# Patient Record
Sex: Male | Born: 1945 | Race: White | Hispanic: No | Marital: Married | Smoking: Former smoker
Health system: Southern US, Community
[De-identification: ages and names within clinical notes are randomized; demographics above are authoritative.]

## PROBLEM LIST (undated history)

## (undated) DIAGNOSIS — N400 Enlarged prostate without lower urinary tract symptoms: Secondary | ICD-10-CM

## (undated) DIAGNOSIS — H547 Unspecified visual loss: Secondary | ICD-10-CM

## (undated) DIAGNOSIS — F0280 Dementia in other diseases classified elsewhere without behavioral disturbance: Secondary | ICD-10-CM

## (undated) DIAGNOSIS — J309 Allergic rhinitis, unspecified: Secondary | ICD-10-CM

## (undated) DIAGNOSIS — K635 Polyp of colon: Secondary | ICD-10-CM

## (undated) DIAGNOSIS — F329 Major depressive disorder, single episode, unspecified: Secondary | ICD-10-CM

## (undated) DIAGNOSIS — G309 Alzheimer's disease, unspecified: Secondary | ICD-10-CM

## (undated) DIAGNOSIS — I10 Essential (primary) hypertension: Secondary | ICD-10-CM

## (undated) DIAGNOSIS — G40909 Epilepsy, unspecified, not intractable, without status epilepticus: Secondary | ICD-10-CM

## (undated) DIAGNOSIS — F32A Depression, unspecified: Secondary | ICD-10-CM

## (undated) DIAGNOSIS — E785 Hyperlipidemia, unspecified: Secondary | ICD-10-CM

## (undated) HISTORY — DX: Hyperlipidemia, unspecified: E78.5

## (undated) HISTORY — DX: Dementia in other diseases classified elsewhere without behavioral disturbance: F02.80

## (undated) HISTORY — DX: Benign prostatic hyperplasia without lower urinary tract symptoms: N40.0

## (undated) HISTORY — DX: Essential (primary) hypertension: I10

## (undated) HISTORY — DX: Polyp of colon: K63.5

## (undated) HISTORY — DX: Major depressive disorder, single episode, unspecified: F32.9

## (undated) HISTORY — DX: Depression, unspecified: F32.A

## (undated) HISTORY — DX: Epilepsy, unspecified, not intractable, without status epilepticus: G40.909

## (undated) HISTORY — PX: CHOLECYSTECTOMY: SHX55

## (undated) HISTORY — DX: Unspecified visual loss: H54.7

## (undated) HISTORY — DX: Alzheimer's disease, unspecified: G30.9

## (undated) HISTORY — DX: Allergic rhinitis, unspecified: J30.9

---

## 2001-04-10 DIAGNOSIS — F0280 Dementia in other diseases classified elsewhere without behavioral disturbance: Secondary | ICD-10-CM

## 2001-04-10 HISTORY — DX: Dementia in other diseases classified elsewhere, unspecified severity, without behavioral disturbance, psychotic disturbance, mood disturbance, and anxiety: F02.80

## 2012-07-01 LAB — LIPID PANEL
HDL: 64 mg/dL (ref 35–70)
LDL Cholesterol: 90 mg/dL
Triglycerides: 69 mg/dL (ref 40–160)

## 2012-10-21 LAB — BASIC METABOLIC PANEL
BUN: 10 mg/dL (ref 4–21)
Creatinine: 0.9 mg/dL (ref 0.6–1.3)
Glucose: 105 mg/dL
Potassium: 4.6 mmol/L (ref 3.4–5.3)
Sodium: 141 mmol/L (ref 137–147)

## 2012-11-12 ENCOUNTER — Ambulatory Visit: Payer: Self-pay | Admitting: Neurology

## 2012-12-03 ENCOUNTER — Encounter: Payer: Self-pay | Admitting: Family Medicine

## 2012-12-03 ENCOUNTER — Ambulatory Visit (INDEPENDENT_AMBULATORY_CARE_PROVIDER_SITE_OTHER): Payer: Medicare PPO | Admitting: Family Medicine

## 2012-12-03 VITALS — BP 120/70 | HR 56 | Temp 97.7°F | Ht 68.0 in | Wt 189.0 lb

## 2012-12-03 DIAGNOSIS — I10 Essential (primary) hypertension: Secondary | ICD-10-CM

## 2012-12-03 DIAGNOSIS — Z111 Encounter for screening for respiratory tuberculosis: Secondary | ICD-10-CM

## 2012-12-03 DIAGNOSIS — F0280 Dementia in other diseases classified elsewhere without behavioral disturbance: Secondary | ICD-10-CM | POA: Insufficient documentation

## 2012-12-03 DIAGNOSIS — E785 Hyperlipidemia, unspecified: Secondary | ICD-10-CM

## 2012-12-03 DIAGNOSIS — G40909 Epilepsy, unspecified, not intractable, without status epilepticus: Secondary | ICD-10-CM

## 2012-12-03 NOTE — Patient Instructions (Addendum)
It was so nice to meet you both. Please call your pharmacy and let them know I will be taking over refilling his medications.  Please come see me in 2 months.

## 2012-12-03 NOTE — Progress Notes (Signed)
  Subjective:    Patient ID: Eugene Mcguire, male    DOB: 05/04/1945, 67 y.o.   MRN: [id number]  HPI  67 yo very pleasant male here with his wife to establish care.  Recently moved from [facility] to [facility]. He is a retired IT sales professional.  Alzheimer's disease- visual spatial variant.  Was followed a couple of times by Dr. Shannan Harper at [facility] but has since transferred care to Dr. Malvin Johns at [facility]. Symptoms have been progressing although not rapidly- diagnosed in 2003.  On Namenda 10 mg twice daily and Aricept 10 mg daily. Has participated in several studies at [facility]. He attends the [facility] 2 days per week at [facility]. UTD ophthalmologist.  Also takes Zoloft 25 mg daily.  Seizure disorder- in June 2014 (the day before they moved to [facility]), he had a seizure.  Went to [facility] ER.  Started on Lamotrigine and dose is being titrated up by Dr. Malvin Johns. Per pt's wife, EEG and MRI last week. Has had no further seizures or seizure like activity.  HTN- on Ramipril 5 mg daily and HCTZ 25 mg daily.    HLD- lipid panel UTD- entered in epic. On Lipitor 10 mg daily.  Patient Active Problem List   Diagnosis Date Noted  . HTN (hypertension) 12/03/2012  . Hyperlipidemia   . Alzheimer disease   . Seizure disorder    Past Medical History  Diagnosis Date  . Hyperlipidemia   . Hypertension   . Alzheimer's dementia 2003    visualspacial difficulties  . Colon polyps   . Seizure disorder    Past Surgical History  Procedure Laterality Date  . Cholecystectomy     History  Substance Use Topics  . Smoking status: Former Games developer  . Smokeless tobacco: Not on file     Comment: smoked in college  . Alcohol Use: Not on file   Family History  Problem Relation Age of Onset  . Alcohol abuse Father   . Heart disease Father    Allergies  Allergen Reactions  . Rimantadine Nausea And Vomiting    Hallucinations   No current  outpatient prescriptions on file prior to visit.   No current facility-administered medications on file prior to visit.   The PMH, PSH, Social History, Family History, Medications, and allergies have been reviewed in [facility], and have been updated if relevant.     Review of Systems See HPI    Objective:   Physical Exam BP 120/70  Pulse 56  Temp(Src) 97.7 F (36.5 C)  Ht 5\' 8"  (1.727 m)  Wt 189 lb (85.73 kg)  BMI 28.74 kg/m2 General:  Pleasant male, squinting, does respond to questions, has difficulty with names of family members and history of his illness. Extremities:  no edema     Assessment & Plan:  1. Screening for tuberculosis  - TB Skin Test  2. Seizure disorder On Lamictal.  Followed by Dr. Malvin Johns.  3. Hyperlipidemia Continue Lipitor.  No changes.  Not yet due for labs.  4. Alzheimer disease >45 min spent with face to face with patient, >50% counseling and/or coordinating care and reviewing history and old records. Will continue Namenda (advised his wife he will need to change to XR dosing) and Aricept. Very active in support groups and activities at [facility].  Very supportive wife.   5. HTN (hypertension)  Stable on current rx.  No changes.

## 2012-12-04 ENCOUNTER — Telehealth: Payer: Self-pay | Admitting: *Deleted

## 2012-12-04 MED ORDER — SERTRALINE HCL 25 MG PO TABS: 25.0000 mg | ORAL_TABLET | Freq: Every day | ORAL | Status: DC

## 2012-12-04 MED ORDER — DONEPEZIL HCL 10 MG PO TABS: 10.0000 mg | ORAL_TABLET | Freq: Every day | ORAL | Status: DC

## 2012-12-04 NOTE — Telephone Encounter (Signed)
Yes ok to remain on regular and to refill rx.

## 2012-12-04 NOTE — Telephone Encounter (Signed)
Received refill request for pt's Namenda 10mg  with a note that pt's wife mentioned you were considering switching pt to Namenda XR. Due to product availability pharmacy cannot get the XR, but they have plenty of the regular. Is ok for pt to remain on the regular? If so they need a new rx.

## 2012-12-04 NOTE — Addendum Note (Signed)
Addended by: Baldomero Lamy on: 12/04/2012 01:40 PM   Modules accepted: Orders

## 2012-12-04 NOTE — Telephone Encounter (Signed)
Advised pharmacy.

## 2012-12-05 LAB — TB SKIN TEST
Induration: 0 mm
TB Skin Test: NEGATIVE

## 2012-12-06 ENCOUNTER — Other Ambulatory Visit: Payer: Self-pay | Admitting: *Deleted

## 2012-12-06 MED ORDER — MEMANTINE HCL 10 MG PO TABS: 10.0000 mg | ORAL_TABLET | Freq: Two times a day (BID) | ORAL | Status: DC

## 2012-12-19 ENCOUNTER — Other Ambulatory Visit: Payer: Self-pay

## 2012-12-19 MED ORDER — HYDROCHLOROTHIAZIDE 25 MG PO TABS: 25.0000 mg | ORAL_TABLET | Freq: Every day | ORAL | Status: DC

## 2012-12-19 MED ORDER — RAMIPRIL 5 MG PO CAPS: 5.0000 mg | ORAL_CAPSULE | Freq: Every day | ORAL | Status: DC

## 2012-12-19 MED ORDER — ATORVASTATIN CALCIUM 10 MG PO TABS: 10.0000 mg | ORAL_TABLET | Freq: Every day | ORAL | Status: DC

## 2012-12-19 NOTE — Telephone Encounter (Signed)
Eugene Mcguire request refill ramipril. Atorvastatin, and HCTZ to [facility] [address]. Advised done.

## 2013-01-29 ENCOUNTER — Ambulatory Visit (INDEPENDENT_AMBULATORY_CARE_PROVIDER_SITE_OTHER): Payer: Medicare PPO

## 2013-01-29 DIAGNOSIS — Z23 Encounter for immunization: Secondary | ICD-10-CM

## 2013-02-04 ENCOUNTER — Ambulatory Visit: Payer: Medicare PPO | Admitting: Family Medicine

## 2013-02-05 ENCOUNTER — Ambulatory Visit: Payer: Medicare PPO | Admitting: Family Medicine

## 2013-02-13 ENCOUNTER — Ambulatory Visit (INDEPENDENT_AMBULATORY_CARE_PROVIDER_SITE_OTHER): Payer: Medicare PPO | Admitting: Internal Medicine

## 2013-02-13 ENCOUNTER — Encounter: Payer: Self-pay | Admitting: Internal Medicine

## 2013-02-13 VITALS — BP 118/68 | HR 66 | Temp 97.9°F | Wt 190.0 lb

## 2013-02-13 DIAGNOSIS — F0280 Dementia in other diseases classified elsewhere without behavioral disturbance: Secondary | ICD-10-CM

## 2013-02-13 NOTE — Progress Notes (Signed)
Subjective:    Patient ID: Eugene Mcguire, male    DOB: March 31, 1946, 67 y.o.   MRN: [id number]  HPI  Pt presents to the clinic today with 2 month follow up for Alzheimers- visual spatial varient. He was switched to Namenda XR at his last visit. However, the pharmacy did not have the XR so he continued on his regular Namenda. He has not noticed any issues. He has seen his neurologist. EEG was negative for seizures. MRI of the brain showed progressive atrophy. They did increase his Lamictal. He has been tolerating that well. He would like to know if he could get a handicap placard.  Review of Systems      Past Medical History  Diagnosis Date  . Hyperlipidemia   . Hypertension   . Alzheimer's dementia 2003    visualspacial difficulties  . Colon polyps   . Seizure disorder     Current Outpatient Prescriptions  Medication Sig Dispense Refill  . aspirin EC 81 MG tablet Take 81 mg by mouth daily.      Marland Kitchen atorvastatin (LIPITOR) 10 MG tablet Take 1 tablet (10 mg total) by mouth daily.  90 tablet  1  . B Complex Vitamins (VITAMIN B COMPLEX PO) Take by mouth.      . Coenzyme Q10 (COQ-10) 200 MG CAPS Take by mouth.      . donepezil (ARICEPT) 10 MG tablet Take 1 tablet (10 mg total) by mouth at bedtime.  90 tablet  1  . hydrochlorothiazide (HYDRODIURIL) 25 MG tablet Take 1 tablet (25 mg total) by mouth daily.  90 tablet  1  . lamoTRIgine (LAMICTAL) 25 MG tablet Take 2 tablets daily every morning      . memantine (NAMENDA) 10 MG tablet Take 1 tablet (10 mg total) by mouth 2 (two) times daily.  60 tablet  3  . Multiple Vitamin (MULTIVITAMIN) tablet Take 1 tablet by mouth daily.      . Omega-3 Fatty Acids (FISH OIL) 1200 MG CAPS Take 2 capsules by mouth daily      . ramipril (ALTACE) 5 MG capsule Take 1 capsule (5 mg total) by mouth daily.  90 capsule  1  . sertraline (ZOLOFT) 25 MG tablet Take 1 tablet (25 mg total) by mouth at bedtime.  90 tablet  0   No current facility-administered  medications for this visit.    Allergies  Allergen Reactions  . Rimantadine Nausea And Vomiting    Hallucinations    Family History  Problem Relation Age of Onset  . Alcohol abuse Father   . Heart disease Father     History   Social History  . Marital Status: Married    Spouse Name: N/A    Number of Children: N/A  . Years of Education: N/A   Occupational History  . Not on file.   Social History Main Topics  . Smoking status: Former Games developer  . Smokeless tobacco: Never Used     Comment: smoked in college  . Alcohol Use: Not on file  . Drug Use: Not on file  . Sexual Activity: Not on file   Other Topics Concern  . Not on file   Social History Narrative   Retired Presenter, broadcasting school principal.   Recently moved with his wife (a retired Charity fundraiser) from [place] to [place].   Two grown children- 3 grandchildren.     Constitutional: Denies fever, malaise, fatigue, headache or abrupt weight changes.  HEENT: Denies eye pain, eye redness, ear pain,  ringing in the ears, wax buildup, runny nose, nasal congestion, bloody nose, or sore throat.  Neurological: Pt reports difficulty with memory. Denies dizziness, difficulty with speech or problems with balance and coordination.   No other specific complaints in a complete review of systems (except as listed in HPI above).  Objective:   Physical Exam   BP 118/68  Pulse 66  Temp(Src) 97.9 F (36.6 C) (Tympanic)  Wt 190 lb (86.183 kg)  SpO2 98% Wt Readings from Last 3 Encounters:  02/13/13 190 lb (86.183 kg)  12/03/12 189 lb (85.73 kg)    General: Appears his stated age, well developed, well nourished in NAD. HEENT: Head: normal shape and size; Eyes: sclera white, no icterus, conjunctiva pink; Ears: Tm's gray and intact, normal light reflex; Nose: mucosa pink and moist, septum midline; Throat/Mouth: Teeth present, mucosa pink and moist, no exudate, lesions or ulcerations noted.  Cardiovascular: Normal rate and  rhythm. S1,S2 noted.  No murmur, rubs or gallops noted. No JVD or BLE edema. No carotid bruits noted. Pulmonary/Chest: Normal effort and positive vesicular breath sounds. No respiratory distress. No wheezes, rales or ronchi noted.  Neurological: Alert and oriented. Coordination normal. +DTRs bilaterally.   BMET    Component Value Date/Time   NA 141 10/21/2012   K 4.6 10/21/2012   BUN 10 10/21/2012   CREATININE 0.9 10/21/2012    Lipid Panel     Component Value Date/Time   CHOL 167 07/01/2012   TRIG 69 07/01/2012   HDL 64 07/01/2012   LDLCALC 90 07/01/2012    CBC    Component Value Date/Time   HGB 13.9 10/21/2012   HCT 42 10/21/2012   PLT 356 10/21/2012          Assessment & Plan:

## 2013-02-13 NOTE — Patient Instructions (Signed)
Alzheimer's Disease Caregiver Guide Alzheimer's disease is an illness that affects a person's brain. It causes a person to lose the ability to remember things and make good decisions. As the disease progresses, the person is unable to take care of himself or herself and needs more and more help to do simple tasks. Taking care of someone with Alzheimer's disease can be very challenging and overwhelming.  MEMORY LOSS AND CONFUSION Memory loss and confusion is mild in the beginning stages of the disease. Both of these problems become more severe as the disease progresses. Eventually, the person will not recognize places or even close family members and friends.   Stay calm.  Respond with a short explanation. Long explanations can be overwhelming and confusing.  Avoid corrections that sound like scolding.  Try not to take it personally, even if the person forgets your name. BEHAVIOR CHANGES Behavior changes are part of the disease. The person may develop depression, anxiety, anger, hallucinations, or other behavior changes. These changes can come on suddenly and may be in response to pain, infection, changes in the environment (temperature, noise), overstimulation, or feeling lost or scared.   Try not to take behavior changes personally.  Remain calm and patient.  Do not argue or try to convince the person about a specific point. This will only make him or her more agitated.  Know that the behavior changes are part of the disease process and try to work through it. TIPS TO REDUCE FRUSTRATION  Schedule wisely by making appointments and doing daily tasks, like bathing and dressing, when the person is at his or her best.  Take your time. Simple tasks may take a lot longer, so be sure to allow for plenty of time.  Limit choices. Too many choices can be overwhelming and stressful for the person.  Involve the person in what you are doing.  Stick to a routine.  Avoid new or crowded  situations, if possible.  Use simple words, short sentences, and a calm voice. Only give 1 direction at a time.  Buy clothes and shoes that are easy to put on and take off.  Let people help if they offer. HOME SAFETY Keeping the home safe is very important to reduce the risk of falls and injuries.   Keep floors clear of clutter. Remove rugs, magazine racks, and floor lamps.  Keep hallways well lit.  Put a handrail and nonslip mat in the bathtub or shower.  Put childproof locks on cabinets with dangerous items, such as medicine, alcohol, guns, toxic cleaning items, sharp tools or utensils, matches, or lighters.  Place locks on doors where the person cannot easily see or reach them. This helps ensure that the person cannot wander out of the house and get lost.  Be prepared for emergencies. Keep a list of emergency phone numbers and addresses in a convenient area. PLANS FOR THE FUTURE  Do not put off talking about finances.  Talk about money management. People with Alzheimer's disease have trouble managing their money as the disease gets worse.  Get help from professional advisors regarding financial and legal matters.  Do not put off talking about future care.  Choose a power of attorney. This is someone who can make decisions for the person with Alzheimer's disease when he or she is no longer able to do so.  Talk about driving and when it is the right time to stop. The person's doctor can help give advice on this matter.  Talk about the person's   living situation. If he or she lives alone, you need to make sure he or she is safe. Some people need extra help at home, and others need more care at a nursing home or care center. SUPPORT GROUPS Joining a support group can be very helpful for caregivers of people with Alzheimer's disease. Some advantages to being part of a support group include:   Getting strategies to manage stress.  Sharing experiences with others.  Receiving  emotional comfort and support.  Learning new caregiving skills as the disease progresses.  Knowing what community resources are available and taking advantage of them. SEEK MEDICAL CARE IF:  The person has a fever.  The person has a sudden change in behavior that does not improve with calming strategies.  The person is unable to manage in his or her current living situation.  The person threatens you or anyone else, including himself or herself.  You are no longer able to care for the person. Document Released: 12/07/2003 Document Revised: 09/26/2011 Document Reviewed: 05/03/2011 ExitCare Patient Information 2014 [employer].  

## 2013-02-13 NOTE — Assessment & Plan Note (Signed)
Continues on Namenda and Aricept No medication changes at this time Will fill out a permanent handicap placard and mail to you

## 2013-02-24 ENCOUNTER — Other Ambulatory Visit: Payer: Self-pay | Admitting: Family Medicine

## 2013-02-24 MED ORDER — MEMANTINE HCL 10 MG PO TABS: 10.0000 mg | ORAL_TABLET | Freq: Two times a day (BID) | ORAL | Status: DC

## 2013-02-24 MED ORDER — SERTRALINE HCL 25 MG PO TABS: 25.0000 mg | ORAL_TABLET | Freq: Every day | ORAL | Status: DC

## 2013-02-24 NOTE — Telephone Encounter (Signed)
Pt is needing refill on Namenda 90 day supply and zoloft. Pt uses [facility] on [address].

## 2013-02-25 ENCOUNTER — Encounter: Payer: Self-pay | Admitting: Internal Medicine

## 2013-02-25 ENCOUNTER — Telehealth: Payer: Self-pay

## 2013-02-25 MED ORDER — MEMANTINE HCL 10 MG PO TABS: 10.0000 mg | ORAL_TABLET | Freq: Two times a day (BID) | ORAL | Status: DC

## 2013-02-25 NOTE — Telephone Encounter (Signed)
pts wife left v/m requesting 90 day refill Namenda to [facility] [address]. Spoke with Melissa at [facility] [address] and namenda 10 mg # 180 x 0 given over phone to take place of # 60 x 3 refills sent 02/24/13. This had been verbally OKed by Nicki Reaper NP. Left v/m for Mrs Holst to call office.

## 2013-02-26 NOTE — Telephone Encounter (Signed)
Mrs Wurm notified med called to [facility] [address].

## 2013-05-22 ENCOUNTER — Encounter: Payer: Self-pay | Admitting: Family Medicine

## 2013-05-22 ENCOUNTER — Ambulatory Visit (INDEPENDENT_AMBULATORY_CARE_PROVIDER_SITE_OTHER): Payer: Medicare PPO | Admitting: Family Medicine

## 2013-05-22 ENCOUNTER — Telehealth: Payer: Self-pay

## 2013-05-22 VITALS — BP 122/66 | HR 61 | Temp 97.9°F | Wt 190.5 lb

## 2013-05-22 DIAGNOSIS — R350 Frequency of micturition: Secondary | ICD-10-CM

## 2013-05-22 DIAGNOSIS — R351 Nocturia: Secondary | ICD-10-CM | POA: Insufficient documentation

## 2013-05-22 LAB — POCT URINALYSIS DIPSTICK
BILIRUBIN UA: NEGATIVE
Glucose, UA: NEGATIVE
Ketones, UA: NEGATIVE
Leukocytes, UA: NEGATIVE
Nitrite, UA: NEGATIVE
PH UA: 6.5
Protein, UA: NEGATIVE
RBC UA: NEGATIVE
Spec Grav, UA: 1.01
Urobilinogen, UA: 0.2

## 2013-05-22 MED ORDER — SERTRALINE HCL 25 MG PO TABS: 25.0000 mg | ORAL_TABLET | Freq: Every day | ORAL | Status: DC

## 2013-05-22 MED ORDER — MEMANTINE HCL 10 MG PO TABS: 10.0000 mg | ORAL_TABLET | Freq: Two times a day (BID) | ORAL | Status: DC

## 2013-05-22 MED ORDER — DONEPEZIL HCL 10 MG PO TABS: 10.0000 mg | ORAL_TABLET | Freq: Every day | ORAL | Status: DC

## 2013-05-22 NOTE — Progress Notes (Signed)
Pre-visit discussion using our clinic review tool. No additional management support is needed unless otherwise documented below in the visit note.  

## 2013-05-22 NOTE — Assessment & Plan Note (Signed)
UA neg. Reassurance provided that this is not infectious. ? Some bladder misfirings due to his disease. Continue depends at night. His wife will update me next week- I have a follow up appt scheduled with him in one month.  He will keep this appt.

## 2013-05-22 NOTE — Progress Notes (Signed)
Subjective:    Patient ID: Eugene Mcguire, male    DOB: 05-24-1945, 68 y.o.   MRN: [id number]  Urinary Frequency  Associated symptoms include frequency.    68 yo very pleasant male with h/o visual spatial AD here for increased urinary frequency, mainly at night x 1 week.  Has had a few episodes of incontinence.  No dysuria, back pain, nausea, vomiting or fevers.  Uses bedside commode.  Did have some myoclonic reflexes the morning this started.  Alzheimer's disease- visual spatial variant.  Was followed a couple of times by [facility] but has since transferred care to [facility]vin [facility]. Symptoms have been progressing although not rapidly- diagnosed in 2003.  On Namenda 10 mg twice daily and Aricept 10 mg daily. Has [facility] studies at [facility]. He [facility].  Patient Active Problem List   Diagnosis Date Noted  . Nocturia 05/22/2013  . HTN (hypertension) 12/03/2012  . Hyperlipidemia   . Alzheimer disease   . Seizure disorder    Past Medical History  Diagnosis Date  . Hyperlipidemia   . Hypertension   . Alzheimer's dementia 2003    visualspacial difficulties  . Colon polyps   . Seizure disorder    Past Surgical History  Procedure Laterality Date  . Cholecystectomy     History  Substance Use Topics  . Smoking status: Former Games developermoker  . Smokeless tobacco: Never Used     Comment: smoked in college  . Alcohol Use: Not on file   Family History  Problem Relation Age of Onset  . Alcohol abuse Father   . Heart disease Father    Allergies  Allergen Reactions  . Rimantadine Nausea And Vomiting    Hallucinations   Current Outpatient Prescriptions on File Prior to Visit  Medication Sig Dispense Refill  . aspirin EC 81 MG tablet Take 81 mg by mouth daily.      Marland Kitchen. atorvastatin (LIPITOR) 10 MG tablet Take 1 tablet (10 mg total) by mouth daily.  90 tablet   1  . B Complex Vitamins (VITAMIN B COMPLEX PO) Take by mouth.      . Coenzyme Q10 (COQ-10) 200 MG CAPS Take by mouth.      . donepezil (ARICEPT) 10 MG tablet Take 1 tablet (10 mg total) by mouth at bedtime.  90 tablet  1  . hydrochlorothiazide (HYDRODIURIL) 25 MG tablet Take 1 tablet (25 mg total) by mouth daily.  90 tablet  1  . lamoTRIgine (LAMICTAL) 100 MG tablet Take 100 mg by mouth 2 (two) times daily.      . memantine (NAMENDA) 10 MG tablet Take 1 tablet (10 mg total) by mouth 2 (two) times daily.  180 tablet  0  . Multiple Vitamin (MULTIVITAMIN) tablet Take 1 tablet by mouth daily.      . Omega-3 Fatty Acids (FISH OIL) 1200 MG CAPS Take 2 capsules by mouth daily      . ramipril (ALTACE) 5 MG capsule Take 1 capsule (5 mg total) by mouth daily.  90 capsule  1  . sertraline (ZOLOFT) 25 MG tablet Take 1 tablet (25 mg total) by mouth at bedtime.  90 tablet  0   No current facility-administered medications on file prior to visit.   The PMH, PSH, Social History, Family History, Medications, and allergies have been reviewed in [facility], and have been updated if relevant.  Review of Systems  Genitourinary: Positive for frequency.   See HPI    Objective:   Physical Exam BP 122/66  Pulse 61  Temp(Src) 97.9 F (36.6 C) (Oral)  Wt 190 lb 8 oz (86.41 kg)  SpO2 98% General:  Pleasant male, squinting, does respond to questions, has difficulty with names of family members and history of his illness. Abd: soft, NT Extremities:  no edema     Assessment & Plan:

## 2013-05-22 NOTE — Telephone Encounter (Signed)
Did not receive cb but pt kept appt. Today to see Dr Dayton MartesAron.

## 2013-05-22 NOTE — Telephone Encounter (Signed)
Mrs Eugene Mcguire left v/m; pt has appt today at 12 noon with Dr Dayton MartesAron and pt has voided 20 cc in urine cup; Mrs Eugene Mcguire wants to know if that is sufficient. Left v/m for pt to cb.

## 2013-06-12 ENCOUNTER — Ambulatory Visit: Payer: Medicare PPO | Admitting: Family Medicine

## 2013-06-19 ENCOUNTER — Ambulatory Visit (INDEPENDENT_AMBULATORY_CARE_PROVIDER_SITE_OTHER): Payer: Medicare PPO | Admitting: Family Medicine

## 2013-06-19 ENCOUNTER — Encounter: Payer: Self-pay | Admitting: Family Medicine

## 2013-06-19 VITALS — BP 124/78 | HR 65 | Temp 98.3°F | Wt 188.5 lb

## 2013-06-19 DIAGNOSIS — E785 Hyperlipidemia, unspecified: Secondary | ICD-10-CM

## 2013-06-19 DIAGNOSIS — G309 Alzheimer's disease, unspecified: Principal | ICD-10-CM

## 2013-06-19 DIAGNOSIS — G40909 Epilepsy, unspecified, not intractable, without status epilepticus: Secondary | ICD-10-CM

## 2013-06-19 DIAGNOSIS — F0280 Dementia in other diseases classified elsewhere without behavioral disturbance: Secondary | ICD-10-CM

## 2013-06-19 DIAGNOSIS — Z9989 Dependence on other enabling machines and devices: Secondary | ICD-10-CM

## 2013-06-19 DIAGNOSIS — I10 Essential (primary) hypertension: Secondary | ICD-10-CM

## 2013-06-19 DIAGNOSIS — G4733 Obstructive sleep apnea (adult) (pediatric): Secondary | ICD-10-CM

## 2013-06-19 LAB — LIPID PANEL
CHOL/HDL RATIO: 3
Cholesterol: 181 mg/dL (ref 0–200)
HDL: 66.4 mg/dL (ref >39.00)
LDL CALC: 95 mg/dL (ref 0–99)
Triglycerides: 96 mg/dL (ref 0.0–149.0)
VLDL: 19.2 mg/dL (ref 0.0–40.0)

## 2013-06-19 LAB — COMPREHENSIVE METABOLIC PANEL
ALK PHOS: 56 U/L (ref 39–117)
ALT: 20 U/L (ref 0–53)
AST: 22 U/L (ref 0–37)
Albumin: 4.1 g/dL (ref 3.5–5.2)
BILIRUBIN TOTAL: 0.5 mg/dL (ref 0.3–1.2)
BUN: 12 mg/dL (ref 6–23)
CO2: 33 meq/L — AB (ref 19–32)
Calcium: 9.5 mg/dL (ref 8.4–10.5)
Chloride: 100 mEq/L (ref 96–112)
Creatinine, Ser: 0.9 mg/dL (ref 0.4–1.5)
GFR: 94.88 mL/min (ref >60.00)
Glucose, Bld: 87 mg/dL (ref 70–99)
Potassium: 3.9 mEq/L (ref 3.5–5.1)
Sodium: 137 mEq/L (ref 135–145)
TOTAL PROTEIN: 6.8 g/dL (ref 6.0–8.3)

## 2013-06-19 MED ORDER — RAMIPRIL 5 MG PO CAPS: 5.0000 mg | ORAL_CAPSULE | Freq: Every day | ORAL | Status: DC

## 2013-06-19 MED ORDER — ATORVASTATIN CALCIUM 10 MG PO TABS: 10.0000 mg | ORAL_TABLET | Freq: Every day | ORAL | Status: DC

## 2013-06-19 MED ORDER — HYDROCHLOROTHIAZIDE 25 MG PO TABS: 25.0000 mg | ORAL_TABLET | Freq: Every day | ORAL | Status: DC

## 2013-06-19 NOTE — Assessment & Plan Note (Signed)
Continue current dose of lipitor. Recheck lipids today.

## 2013-06-19 NOTE — Progress Notes (Signed)
Pre visit review using our clinic review tool, if applicable. No additional management support is needed unless otherwise documented below in the visit note. 

## 2013-06-19 NOTE — Assessment & Plan Note (Signed)
Continue current rx. Symptoms stable.

## 2013-06-19 NOTE — Assessment & Plan Note (Signed)
No recurrent seizures

## 2013-06-19 NOTE — Progress Notes (Signed)
Subjective:    Patient ID: Eugene Mcguire, male    DOB: 29-Mar-1946, 68 y.o.   MRN: [id number]  HPI  Very pleasant 68 yo male here for 6 month follow up.    [place] [facility]. He is a retired IT sales professionalelementary school principal.  Nocturia has improved. Alzheimer's disease- visual spatial variant.  Was followed a couple of times by Dr. Shannan HarperStrittmatter at [facility] but has since transferred care to Dr. Malvin [facility]Potter [facility].  Last saw him in February.  Symptoms have been progressing although not rapidly- diagnosed in 2003.  On Namenda 10 mg twice daily and Aricept 10 mg daily.  He has seen his neurologist. EEG was negative for seizures. MRI of the brain showed progressive atrophy. They did increase his Lamictal to 100 mg twice daily. He has been tolerating that well. He would like to know if he could get a handicap placard.  He att[facility].  Really enjoys this--especially deserts they serve there.  UTD ophthalmologist.  Also takes Zoloft 25 mg daily.   HTN- on Ramipril 5 mg daily and HCTZ 25 mg daily.   Lab Results  Component Value Date   CREATININE 0.9 10/21/2012     HLD-  On Lipitor 10 mg daily. Lab Results  Component Value Date   CHOL 167 07/01/2012   HDL 64 07/01/2012   LDLCALC 90 07/01/2012   TRIG 69 07/01/2012     Patient Active Problem List   Diagnosis Date Noted  . Nocturia 05/22/2013  . HTN (hypertension) 12/03/2012  . Hyperlipidemia   . Alzheimer disease   . Seizure disorder    Past Medical History  Diagnosis Date  . Hyperlipidemia   . Hypertension   . Alzheimer's dementia 2003    visualspacial difficulties  . Colon polyps   . Seizure disorder    Past Surgical History  Procedure Laterality Date  . Cholecystectomy     History  Substance Use Topics  . Smoking status: Former Games developermoker  . Smokeless tobacco: Never Used     Comment: smoked in college  .  Alcohol Use: Not on file   Family History  Problem Relation Age of Onset  . Alcohol abuse Father   . Heart disease Father    Allergies  Allergen Reactions  . Rimantadine Nausea And Vomiting    Hallucinations   Current Outpatient Prescriptions on File Prior to Visit  Medication Sig Dispense Refill  . aspirin EC 81 MG tablet Take 81 mg by mouth daily.      . B Complex Vitamins (VITAMIN B COMPLEX PO) Take by mouth.      . Coenzyme Q10 (COQ-10) 200 MG CAPS Take by mouth.      . donepezil (ARICEPT) 10 MG tablet Take 1 tablet (10 mg total) by mouth at bedtime.  90 tablet  1  . lamoTRIgine (LAMICTAL) 100 MG tablet Take 100 mg by mouth 2 (two) times daily.      . memantine (NAMENDA) 10 MG tablet Take 1 tablet (10 mg total) by mouth 2 (two) times daily.  180 tablet  1  . Multiple Vitamin (MULTIVITAMIN) tablet Take 1 tablet by mouth daily.      . Omega-3 Fatty Acids (FISH OIL) 1200 MG CAPS Take 2 capsules by mouth daily      . sertraline (ZOLOFT) 25 MG tablet Take 1 tablet (25 mg total) by mouth at bedtime.  90  tablet  1   No current facility-administered medications on file prior to visit.   The PMH, PSH, Social History, Family History, Medications, and allergies have been reviewed in [facility], and have been updated if relevant.     Review of Systems See HPI    No CP or SOB Objective:   Physical Exam BP 124/78  Pulse 65  Temp(Src) 98.3 F (36.8 C) (Oral)  Wt 188 lb 8 oz (85.503 kg)  SpO2 97% Wt Readings from Last 3 Encounters:  06/19/13 188 lb 8 oz (85.503 kg)  05/22/13 190 lb 8 oz (86.41 kg)  02/13/13 190 lb (86.183 kg)    General:  Pleasant male, squinting, does respond to questions, has difficulty with names of family members and history of his illness. Resp:  CTA bilaterally CVS:  RRR Extremities:  no edema     Assessment & Plan:

## 2013-06-19 NOTE — Assessment & Plan Note (Signed)
No longer using his CPAP as his wife was worried about him falling in the middle of the night.  Sleeping on his side and feels fine- no hypersomnolence or increased BP

## 2013-06-19 NOTE — Assessment & Plan Note (Signed)
Well controlled. No changes. 

## 2013-06-19 NOTE — Patient Instructions (Signed)
Great to see you.  We will call you with your lab results or you can view them online. 

## 2013-06-20 ENCOUNTER — Encounter: Payer: Self-pay | Admitting: Family Medicine

## 2013-06-20 ENCOUNTER — Telehealth: Payer: Self-pay | Admitting: Family Medicine

## 2013-06-20 NOTE — Telephone Encounter (Signed)
Relevant patient education assigned to patient using Emmi. ° °

## 2013-10-16 ENCOUNTER — Ambulatory Visit: Payer: Medicare PPO | Admitting: Family Medicine

## 2013-10-22 ENCOUNTER — Ambulatory Visit (INDEPENDENT_AMBULATORY_CARE_PROVIDER_SITE_OTHER): Payer: Medicare PPO | Admitting: Family Medicine

## 2013-10-22 ENCOUNTER — Encounter: Payer: Self-pay | Admitting: Family Medicine

## 2013-10-22 ENCOUNTER — Telehealth: Payer: Self-pay | Admitting: Family Medicine

## 2013-10-22 VITALS — BP 116/72 | HR 65 | Temp 98.4°F | Wt 190.0 lb

## 2013-10-22 DIAGNOSIS — G309 Alzheimer's disease, unspecified: Principal | ICD-10-CM

## 2013-10-22 DIAGNOSIS — G40909 Epilepsy, unspecified, not intractable, without status epilepticus: Secondary | ICD-10-CM

## 2013-10-22 DIAGNOSIS — I10 Essential (primary) hypertension: Secondary | ICD-10-CM

## 2013-10-22 DIAGNOSIS — G4733 Obstructive sleep apnea (adult) (pediatric): Secondary | ICD-10-CM

## 2013-10-22 DIAGNOSIS — Z111 Encounter for screening for respiratory tuberculosis: Secondary | ICD-10-CM

## 2013-10-22 DIAGNOSIS — Z9989 Dependence on other enabling machines and devices: Secondary | ICD-10-CM

## 2013-10-22 DIAGNOSIS — F0280 Dementia in other diseases classified elsewhere without behavioral disturbance: Secondary | ICD-10-CM

## 2013-10-22 DIAGNOSIS — E785 Hyperlipidemia, unspecified: Secondary | ICD-10-CM

## 2013-10-22 NOTE — Assessment & Plan Note (Signed)
Visual spatial variant. Progressive but appears to be slow. No changes in rx. Forms filled out. TB skin test given today.

## 2013-10-22 NOTE — Addendum Note (Signed)
Addended by: Desmond DikeKNIGHT, Donnald Tabar H on: 10/22/2013 10:58 AM   Modules accepted: Orders

## 2013-10-22 NOTE — Progress Notes (Signed)
Subjective:    Patient ID: Eugene Mcguire, male    DOB: 28-Jun-1945, 68 y.o.   MRN: [id number]  HPI  Very pleasant 68 yo male here with his wife for 6 month follow up.     Has form to fill out for adult day care- needs TB skin test. [facility].  Really enjoys this--especially deserts they serve there.  Alzheimer's disease- visual spatial variant.  Was followed a couple of times by Dr[facility] Shannan [facility]ittmatter at [facility] but has since transferred care to [facility] Mal[facility]ter [facility]. No recent changes to rx. [facility].    Symptoms have been progressing although not rapidly- diagnosed in 2003.  On Namenda 10 mg twice daily and Aricept 10 mg daily.  Stopped using bedside commode because he would get disoriented at night- increased fall risk.  Now using urinal. They are pleased with this.   HTN- on Ramipril 5 mg daily and HCTZ 25 mg daily.   Lab Results  Component Value Date   CREATININE 0.9 06/19/2013   HLD-  On Lipitor 10 mg daily. Lab Results  Component Value Date   CHOL 181 06/19/2013   HDL 66.40 06/19/2013   LDLCALC 95 06/19/2013   TRIG 96.0 06/19/2013   CHOLHDL 3 06/19/2013     Patient Active Problem List   Diagnosis Date Noted  . OSA on CPAP 06/19/2013  . Nocturia 05/22/2013  . HTN (hypertension) 12/03/2012  . Hyperlipidemia   . Alzheimer disease   . Seizure disorder    Past Medical History  Diagnosis Date  . Hyperlipidemia   . Hypertension   . Alzheimer's dementia 2003    visualspacial difficulties  . Colon polyps   . Seizure disorder    Past Surgical History  Procedure Laterality Date  . Cholecystectomy     History  Substance Use Topics  . Smoking status: Former Games developermoker  . Smokeless tobacco: Never Used     Comment: smoked in college  . Alcohol Use: Not on file   Family History  Problem Relation Age of Onset  . Alcohol  abuse Father   . Heart disease Father    Allergies  Allergen Reactions  . Rimantadine Nausea And Vomiting    Hallucinations   Current Outpatient Prescriptions on File Prior to Visit  Medication Sig Dispense Refill  . aspirin EC 81 MG tablet Take 81 mg by mouth daily.      Marland Kitchen. atorvastatin (LIPITOR) 10 MG tablet Take 1 tablet (10 mg total) by mouth daily.  90 tablet  1  . donepezil (ARICEPT) 10 MG tablet Take 1 tablet (10 mg total) by mouth at bedtime.  90 tablet  1  . hydrochlorothiazide (HYDRODIURIL) 25 MG tablet Take 1 tablet (25 mg total) by mouth daily.  90 tablet  1  . lamoTRIgine (LAMICTAL) 100 MG tablet Take 100 mg by mouth 2 (two) times daily.      . memantine (NAMENDA) 10 MG tablet Take 1 tablet (10 mg total) by mouth 2 (two) times daily.  180 tablet  1  . Multiple Vitamin (MULTIVITAMIN) tablet Take 1 tablet by mouth daily.      . Omega-3 Fatty Acids (FISH OIL) 1200 MG CAPS Take 2 capsules by mouth daily      . ramipril (ALTACE) 5 MG capsule Take 1 capsule (5 mg total) by mouth daily.  90 capsule  1  . sertraline (ZOLOFT) 25 MG tablet Take 1 tablet (25 mg total) by mouth at bedtime.  90 tablet  1   No current facility-administered medications on file prior to visit.   The PMH, PSH, Social History, Family History, Medications, and allergies have been reviewed in [facility], and have been updated if relevant.     Review of Systems See HPI    No CP or SOB Objective:   Physical Exam BP 116/72  Pulse 65  Temp(Src) 98.4 F (36.9 C) (Oral)  Wt 190 lb (86.183 kg)  SpO2 95% Wt Readings from Last 3 Encounters:  10/22/13 190 lb (86.183 kg)  06/19/13 188 lb 8 oz (85.503 kg)  05/22/13 190 lb 8 oz (86.41 kg)    General:  Pleasant male, squinting, does respond to questions, has difficulty with names of family members and history of his illness. Resp:  CTA bilaterally CVS:  RRR Extremities:  no edema     Assessment & Plan:

## 2013-10-22 NOTE — Assessment & Plan Note (Signed)
Well controlled on current rx. No changes. 

## 2013-10-22 NOTE — Progress Notes (Signed)
Pre visit review using our clinic review tool, if applicable. No additional management support is needed unless otherwise documented below in the visit note. 

## 2013-10-22 NOTE — Telephone Encounter (Signed)
Relevant patient education assigned to patient using Emmi. ° °

## 2013-10-24 LAB — TB SKIN TEST: TB Skin Test: NEGATIVE

## 2013-11-20 ENCOUNTER — Other Ambulatory Visit: Payer: Self-pay | Admitting: *Deleted

## 2013-11-20 MED ORDER — SERTRALINE HCL 25 MG PO TABS: 25.0000 mg | ORAL_TABLET | Freq: Every day | ORAL | Status: DC

## 2013-11-20 MED ORDER — HYDROCHLOROTHIAZIDE 25 MG PO TABS: 25.0000 mg | ORAL_TABLET | Freq: Every day | ORAL | Status: DC

## 2013-11-20 MED ORDER — MEMANTINE HCL 10 MG PO TABS: 10.0000 mg | ORAL_TABLET | Freq: Two times a day (BID) | ORAL | Status: DC

## 2013-11-20 MED ORDER — RAMIPRIL 5 MG PO CAPS: 5.0000 mg | ORAL_CAPSULE | Freq: Every day | ORAL | Status: DC

## 2013-11-20 MED ORDER — DONEPEZIL HCL 10 MG PO TABS: 10.0000 mg | ORAL_TABLET | Freq: Every day | ORAL | Status: DC

## 2013-11-21 ENCOUNTER — Ambulatory Visit (INDEPENDENT_AMBULATORY_CARE_PROVIDER_SITE_OTHER): Payer: Medicare PPO | Admitting: Podiatry

## 2013-11-21 ENCOUNTER — Encounter: Payer: Self-pay | Admitting: Podiatry

## 2013-11-21 VITALS — BP 113/61 | HR 66 | Resp 16 | Ht 68.0 in | Wt 185.0 lb

## 2013-11-21 DIAGNOSIS — M79609 Pain in unspecified limb: Secondary | ICD-10-CM

## 2013-11-21 DIAGNOSIS — B351 Tinea unguium: Secondary | ICD-10-CM

## 2013-11-21 DIAGNOSIS — L600 Ingrowing nail: Secondary | ICD-10-CM

## 2013-11-21 NOTE — Progress Notes (Signed)
Subjective:     Patient ID: Eugene Mcguire, male   DOB: Dec 18, 1945, 68 y.o.   MRN: [id number]  HPI patient presents with caregiver with a painful ingrown toenail of the right big toe lateral border stating that they've tried to soak and trimming without relief   Review of Systems  All other systems reviewed and are negative.      Objective:   Physical Exam  Nursing note and vitals reviewed. Constitutional: He is oriented to person, place, and time.  Cardiovascular: Intact distal pulses.   Musculoskeletal: Normal range of motion.  Neurological: He is oriented to person, place, and time.  Skin: Skin is warm.   neurovascular status intact with muscle strength adequate and range of motion subtalar and midtarsal joint within normal limits. Patient has an incurvated right hallux lateral border that's painful when pressed and is noted to have well-perfused digits. No other complaints except for some thickness and discoloration of nailbeds     Assessment:     Ingrown toenail deformity right hallux along with fungal infiltration of both feet nailbeds    Plan:     H&P and condition explained to caregiver. I've recommended removal of the corner the nail and basic treatment for remaining nail bed. Patient understands risk as his caregiver and today I infiltrated 60 mg I can Marcaine mixture remove the lateral border exposed matrix and apply chemical phenol 3 applications 30 seconds followed by alcohol lavaged and sterile dressing instructed on soaks and reappoint

## 2013-11-21 NOTE — Patient Instructions (Signed)

## 2013-11-21 NOTE — Progress Notes (Signed)
   Subjective:    Patient ID: Colen DarlingWilliam Artley, male    DOB: 16-Aug-1945, 68 y.o.   MRN: [id number]  HPI Comments: i have an ingrown toenail on my big toenail on my rt foot. Its been like this for 1 week. Its getting worse. Its sore to the touch. i have been using neosporin, soaks in epsom salt, otc fungus cream, vicks vapor rub, soaks in vinegar and water and pts wife trims toenails.      Review of Systems  Eyes: Positive for visual disturbance.  Cardiovascular: Positive for palpitations.  Musculoskeletal:       Difficulty walking  Skin:       Color change Change in nails  Neurological: Positive for seizures.  Psychiatric/Behavioral: Positive for confusion.  All other systems reviewed and are negative.      Objective:   Physical Exam        Assessment & Plan:

## 2013-12-08 ENCOUNTER — Other Ambulatory Visit: Payer: Self-pay | Admitting: *Deleted

## 2013-12-08 MED ORDER — ATORVASTATIN CALCIUM 10 MG PO TABS: 10.0000 mg | ORAL_TABLET | Freq: Every day | ORAL | Status: DC

## 2013-12-10 ENCOUNTER — Ambulatory Visit (INDEPENDENT_AMBULATORY_CARE_PROVIDER_SITE_OTHER): Payer: Medicare PPO

## 2013-12-10 DIAGNOSIS — Z23 Encounter for immunization: Secondary | ICD-10-CM

## 2013-12-24 ENCOUNTER — Ambulatory Visit: Payer: Medicare PPO | Admitting: Family Medicine

## 2014-02-23 ENCOUNTER — Other Ambulatory Visit: Payer: Self-pay | Admitting: *Deleted

## 2014-02-23 MED ORDER — SERTRALINE HCL 25 MG PO TABS: 25.0000 mg | ORAL_TABLET | Freq: Every day | ORAL | Status: DC

## 2014-02-26 ENCOUNTER — Ambulatory Visit (INDEPENDENT_AMBULATORY_CARE_PROVIDER_SITE_OTHER): Payer: Medicare PPO

## 2014-02-26 ENCOUNTER — Telehealth: Payer: Self-pay

## 2014-02-26 DIAGNOSIS — Z23 Encounter for immunization: Secondary | ICD-10-CM

## 2014-02-26 NOTE — Telephone Encounter (Signed)
Yes he should get prevnar 13.  Pneumovax was likely mis entered previously.

## 2014-02-26 NOTE — Telephone Encounter (Signed)
Pt is scheduled 02/26/14 at 3:30 pm for nurse visit for prevnar 13; pts immunization record has pt getting prevnar 09/02/2009([facility] Pt had first visit with Dr Dayton MartesAron on 12/03/12. Should pt get prevnar 13 today?

## 2014-04-21 ENCOUNTER — Encounter: Payer: Self-pay | Admitting: Family Medicine

## 2014-04-21 ENCOUNTER — Ambulatory Visit (INDEPENDENT_AMBULATORY_CARE_PROVIDER_SITE_OTHER): Payer: Medicare PPO | Admitting: Family Medicine

## 2014-04-21 VITALS — BP 122/78 | HR 60 | Temp 97.9°F | Wt 188.5 lb

## 2014-04-21 DIAGNOSIS — E785 Hyperlipidemia, unspecified: Secondary | ICD-10-CM

## 2014-04-21 DIAGNOSIS — G309 Alzheimer's disease, unspecified: Secondary | ICD-10-CM

## 2014-04-21 DIAGNOSIS — Z9989 Dependence on other enabling machines and devices: Secondary | ICD-10-CM

## 2014-04-21 DIAGNOSIS — G40909 Epilepsy, unspecified, not intractable, without status epilepticus: Secondary | ICD-10-CM

## 2014-04-21 DIAGNOSIS — F0280 Dementia in other diseases classified elsewhere without behavioral disturbance: Secondary | ICD-10-CM

## 2014-04-21 DIAGNOSIS — I10 Essential (primary) hypertension: Secondary | ICD-10-CM

## 2014-04-21 DIAGNOSIS — K5900 Constipation, unspecified: Secondary | ICD-10-CM | POA: Insufficient documentation

## 2014-04-21 DIAGNOSIS — G4733 Obstructive sleep apnea (adult) (pediatric): Secondary | ICD-10-CM

## 2014-04-21 LAB — LIPID PANEL
CHOLESTEROL: 205 mg/dL — AB (ref 0–200)
HDL: 61.7 mg/dL (ref >39.00)
LDL CALC: 106 mg/dL — AB (ref 0–99)
NonHDL: 143.3
TRIGLYCERIDES: 187 mg/dL — AB (ref 0.0–149.0)
Total CHOL/HDL Ratio: 3
VLDL: 37.4 mg/dL (ref 0.0–40.0)

## 2014-04-21 LAB — COMPREHENSIVE METABOLIC PANEL
ALT: 20 U/L (ref 0–53)
AST: 19 U/L (ref 0–37)
Albumin: 4 g/dL (ref 3.5–5.2)
Alkaline Phosphatase: 56 U/L (ref 39–117)
BILIRUBIN TOTAL: 0.5 mg/dL (ref 0.2–1.2)
BUN: 10 mg/dL (ref 6–23)
CALCIUM: 9.2 mg/dL (ref 8.4–10.5)
CHLORIDE: 96 meq/L (ref 96–112)
CO2: 33 mEq/L — ABNORMAL HIGH (ref 19–32)
Creatinine, Ser: 0.8 mg/dL (ref 0.4–1.5)
GFR: 101.51 mL/min (ref >60.00)
Glucose, Bld: 102 mg/dL — ABNORMAL HIGH (ref 70–99)
Potassium: 4.4 mEq/L (ref 3.5–5.1)
SODIUM: 132 meq/L — AB (ref 135–145)
Total Protein: 6.9 g/dL (ref 6.0–8.3)

## 2014-04-21 NOTE — Assessment & Plan Note (Signed)
Well controlled. No changes made today. 

## 2014-04-21 NOTE — Assessment & Plan Note (Signed)
Followed by neurology. No changes made to rx.  

## 2014-04-21 NOTE — Assessment & Plan Note (Signed)
Advised to decrease dose of power pudding. Call or return to clinic prn if these symptoms worsen or fail to improve as anticipated.

## 2014-04-21 NOTE — Progress Notes (Signed)
Subjective:    Patient ID: Eugene DarlingWilliam Malter, male    DOB: 04/29/1945, 69 y.o.   MRN: [id number]  HPI  Very pleasant 69 yo male here with his wife for 6 month follow up.     Has form to fill out for adult day care- needs TB skin test. [facility].  Really enjoys this--especially deserts they serve there.  Alzheimer's disease- visual spatial variant.  Was followed a couple of times by Dr[facility]ann[facility]ittmatter at [facility] but has since transferred care to [facility] Malvin [facility]Potter [facility]. Seeing him for follow up in 1-2 weeks. No recent changes to rx. [facility].    Symptoms have been progressing although not rapidly- diagnosed in 2003.  On Namenda 10 mg twice daily and Aricept 10 mg daily.  Stopped using bedside commode because he would get disoriented at night- increased fall risk.  Now using urinal. They are pleased with this.  He has been using the urinal without difficulty.  Does have intermittent constipation- power pudding effective but sometimes causes a watery stool.  HTN- on Ramipril 5 mg daily and HCTZ 25 mg daily.   Lab Results  Component Value Date   CREATININE 0.9 06/19/2013   HLD-  On Lipitor 10 mg daily. Lab Results  Component Value Date   CHOL 181 06/19/2013   HDL 66.40 06/19/2013   LDLCALC 95 06/19/2013   TRIG 96.0 06/19/2013   CHOLHDL 3 06/19/2013     Patient Active Problem List   Diagnosis Date Noted  . OSA on CPAP 06/19/2013  . Nocturia 05/22/2013  . HTN (hypertension) 12/03/2012  . Hyperlipidemia   . Alzheimer disease   . Seizure disorder    Past Medical History  Diagnosis Date  . Hyperlipidemia   . Hypertension   . Alzheimer's dementia 2003    visualspacial difficulties  . Colon polyps   . Seizure disorder    Past Surgical History  Procedure Laterality Date  . Cholecystectomy     History  Substance Use Topics   . Smoking status: Former Games developermoker  . Smokeless tobacco: Never Used     Comment: smoked in college  . Alcohol Use: Yes   Family History  Problem Relation Age of Onset  . Alcohol abuse Father   . Heart disease Father    Allergies  Allergen Reactions  . Rimantadine Nausea And Vomiting    Hallucinations   Current Outpatient Prescriptions on File Prior to Visit  Medication Sig Dispense Refill  . aspirin EC 81 MG tablet Take 81 mg by mouth daily.    Marland Kitchen. atorvastatin (LIPITOR) 10 MG tablet Take 1 tablet (10 mg total) by mouth daily. 90 tablet 1  . donepezil (ARICEPT) 10 MG tablet Take 1 tablet (10 mg total) by mouth at bedtime. 90 tablet 1  . hydrochlorothiazide (HYDRODIURIL) 25 MG tablet Take 1 tablet (25 mg total) by mouth daily. 90 tablet 1  . lamoTRIgine (LAMICTAL) 100 MG tablet Take 100 mg by mouth 2 (two) times daily.    . memantine (NAMENDA) 10 MG tablet Take 1 tablet (10 mg total) by mouth 2 (two) times daily. 180 tablet 1  . Multiple Vitamin (MULTIVITAMIN) tablet Take 1 tablet by mouth daily.    . Omega-3 Fatty Acids (FISH OIL) 1200 MG CAPS Take 2 capsules by mouth daily    . ramipril (ALTACE) 5  MG capsule Take 1 capsule (5 mg total) by mouth daily. 90 capsule 1  . sertraline (ZOLOFT) 25 MG tablet Take 1 tablet (25 mg total) by mouth at bedtime. 90 tablet 0   No current facility-administered medications on file prior to visit.   The PMH, PSH, Social History, Family History, Medications, and allergies have been reviewed in [facility], and have been updated if relevant.     Review of Systems  Constitutional: Negative.   HENT: Negative.   Respiratory: Negative.   Gastrointestinal: Positive for diarrhea and constipation. Negative for nausea, vomiting, abdominal pain, blood in stool, abdominal distention, anal bleeding and rectal pain.  Endocrine: Negative.   Genitourinary: Negative.   Musculoskeletal: Negative.   Skin: Negative.   Neurological: Negative.   Hematological: Negative.    Psychiatric/Behavioral: Negative.   All other systems reviewed and are negative.     No CP or SOB Sleeping well Wt Readings from Last 3 Encounters:  04/21/14 188 lb 8 oz (85.503 kg)  11/21/13 185 lb (83.915 kg)  10/22/13 190 lb (86.183 kg)    Objective:   Physical Exam BP 122/78 mmHg  Pulse 60  Temp(Src) 97.9 F (36.6 C) (Oral)  Wt 188 lb 8 oz (85.503 kg)  SpO2 97% Wt Readings from Last 3 Encounters:  04/21/14 188 lb 8 oz (85.503 kg)  11/21/13 185 lb (83.915 kg)  10/22/13 190 lb (86.183 kg)    General:  Pleasant male, squinting, does respond to questions, makes jokes- seems joyful today HEENT:  Normocephalic Resp:  CTA bilaterally, normal respiratory effor CVS:  RRR Abd:   Soft, NT, pos BS Extremities:  no edema  Skin:  No rashes Neuro:  Alert, oriented        Assessment & Plan:

## 2014-04-21 NOTE — Progress Notes (Signed)
Pre visit review using our clinic review tool, if applicable. No additional management support is needed unless otherwise documented below in the visit note. 

## 2014-04-21 NOTE — Patient Instructions (Signed)
Great to see you. Happy New year.  We will call you with your lab results.

## 2014-04-21 NOTE — Assessment & Plan Note (Signed)
Due for labs- will recheck today.

## 2014-04-23 ENCOUNTER — Ambulatory Visit: Payer: Medicare PPO | Admitting: Family Medicine

## 2014-05-05 ENCOUNTER — Telehealth: Payer: Self-pay

## 2014-05-05 ENCOUNTER — Other Ambulatory Visit: Payer: Self-pay | Admitting: Family Medicine

## 2014-05-05 DIAGNOSIS — I10 Essential (primary) hypertension: Secondary | ICD-10-CM

## 2014-05-05 NOTE — Telephone Encounter (Signed)
His sodium was low.  If he has cut back on drinking quite as much water and not having any new mental status changes, his sodium is likely within normal limits by now.  Low sodium can be dangerous however, which is why I asked to recheck it.

## 2014-05-05 NOTE — Telephone Encounter (Signed)
Mrs Eugene Mcguire left v/m; pt is scheduled to have labs rechecked on 05/07/14 and pt's wife wants to know if really needs to have labs retested; pt has extreme difficulty getting in and out of car. Mrs Eugene Mcguire wants cb to explain reason in more detail given at result note why pt has to repeat labs.

## 2014-05-06 NOTE — Telephone Encounter (Signed)
Spoke to pts wife and advised per Dr Dayton MartesAron; states that she will keep lab appt 1/28 to repeat sodium

## 2014-05-06 NOTE — Telephone Encounter (Signed)
If she would prefer to not bring him, that is understandable and her choice and likely ok but I obviously cannot say what his sodium level is now without lab results.

## 2014-05-06 NOTE — Telephone Encounter (Signed)
Spoke to pts wife and advised per Dr Dayton MartesAron. She states that since she has reduced his water intake, and he has had no mental status change, are you still wanting to repeat the labs, or is pt ok with current status?

## 2014-05-06 NOTE — Telephone Encounter (Signed)
Lm on pts vm requesting a call back 

## 2014-05-07 ENCOUNTER — Other Ambulatory Visit (INDEPENDENT_AMBULATORY_CARE_PROVIDER_SITE_OTHER): Payer: Medicare PPO

## 2014-05-07 DIAGNOSIS — I10 Essential (primary) hypertension: Secondary | ICD-10-CM

## 2014-05-07 LAB — BASIC METABOLIC PANEL
BUN: 9 mg/dL (ref 6–23)
CHLORIDE: 98 meq/L (ref 96–112)
CO2: 31 mEq/L (ref 19–32)
CREATININE: 0.86 mg/dL (ref 0.40–1.50)
Calcium: 9.2 mg/dL (ref 8.4–10.5)
GFR: 93.37 mL/min (ref >60.00)
Glucose, Bld: 92 mg/dL (ref 70–99)
Potassium: 3.9 mEq/L (ref 3.5–5.1)
Sodium: 136 mEq/L (ref 135–145)

## 2014-05-08 ENCOUNTER — Encounter: Payer: Self-pay | Admitting: *Deleted

## 2014-05-18 ENCOUNTER — Other Ambulatory Visit: Payer: Self-pay | Admitting: *Deleted

## 2014-05-18 MED ORDER — MEMANTINE HCL 10 MG PO TABS: 10.0000 mg | ORAL_TABLET | Freq: Two times a day (BID) | ORAL | Status: DC

## 2014-05-18 MED ORDER — DONEPEZIL HCL 10 MG PO TABS: 10.0000 mg | ORAL_TABLET | Freq: Every day | ORAL | Status: DC

## 2014-05-18 MED ORDER — SERTRALINE HCL 25 MG PO TABS: 25.0000 mg | ORAL_TABLET | Freq: Every day | ORAL | Status: DC

## 2014-06-04 ENCOUNTER — Other Ambulatory Visit: Payer: Self-pay

## 2014-06-04 MED ORDER — RAMIPRIL 5 MG PO CAPS: 5.0000 mg | ORAL_CAPSULE | Freq: Every day | ORAL | Status: DC

## 2014-06-04 MED ORDER — ATORVASTATIN CALCIUM 10 MG PO TABS: 10.0000 mg | ORAL_TABLET | Freq: Every day | ORAL | Status: DC

## 2014-06-04 MED ORDER — HYDROCHLOROTHIAZIDE 25 MG PO TABS: 25.0000 mg | ORAL_TABLET | Freq: Every day | ORAL | Status: DC

## 2014-06-04 NOTE — Telephone Encounter (Signed)
Mrs Kathlene NovemberMcCormick request refills to [facility] [address] for atorvastatin,HCTZ and ramipril.advised pts wife done.

## 2014-06-25 ENCOUNTER — Ambulatory Visit (INDEPENDENT_AMBULATORY_CARE_PROVIDER_SITE_OTHER): Payer: Medicare PPO | Admitting: Internal Medicine

## 2014-06-25 ENCOUNTER — Encounter: Payer: Self-pay | Admitting: Internal Medicine

## 2014-06-25 VITALS — BP 130/76 | HR 62 | Temp 97.8°F | Wt 186.0 lb

## 2014-06-25 DIAGNOSIS — R3911 Hesitancy of micturition: Secondary | ICD-10-CM

## 2014-06-25 DIAGNOSIS — R350 Frequency of micturition: Secondary | ICD-10-CM

## 2014-06-25 DIAGNOSIS — N400 Enlarged prostate without lower urinary tract symptoms: Secondary | ICD-10-CM

## 2014-06-25 LAB — POCT URINALYSIS DIPSTICK
BILIRUBIN UA: NEGATIVE
Blood, UA: NEGATIVE
GLUCOSE UA: NEGATIVE
KETONES UA: NEGATIVE
Leukocytes, UA: NEGATIVE
NITRITE UA: NEGATIVE
PH UA: 6
Protein, UA: NEGATIVE
Spec Grav, UA: 1.015
UROBILINOGEN UA: NEGATIVE

## 2014-06-25 MED ORDER — TAMSULOSIN HCL 0.4 MG PO CAPS: 0.4000 mg | ORAL_CAPSULE | Freq: Every day | ORAL | Status: DC

## 2014-06-25 NOTE — Progress Notes (Signed)
Subjective:    Patient ID: Eugene Mcguire, male    DOB: 11-11-1945, 69 y.o.   MRN: [id number]  HPI  Pt presents to the clinic today with frequency of urination. His wife noticed this 1 month ago. He denies dysuria or blood in his urine. His wife also reports he gets up 5-6 times per night to urinate. He denies fever, chills or low back pain. He has never had a prostate problem that he is aware of. His wife if unsure if he has been screened for prostate cancer. He is on HCTZ but has been on this for some time.  Review of Systems      Past Medical History  Diagnosis Date  . Hyperlipidemia   . Hypertension   . Alzheimer's dementia 2003    visualspacial difficulties  . Colon polyps   . Seizure disorder     Current Outpatient Prescriptions  Medication Sig Dispense Refill  . aspirin EC 81 MG tablet Take 81 mg by mouth daily.    Marland Kitchen. atorvastatin (LIPITOR) 10 MG tablet Take 1 tablet (10 mg total) by mouth daily. 90 tablet 1  . donepezil (ARICEPT) 10 MG tablet Take 1 tablet (10 mg total) by mouth at bedtime. 90 tablet 1  . hydrochlorothiazide (HYDRODIURIL) 25 MG tablet Take 1 tablet (25 mg total) by mouth daily. 90 tablet 1  . lamoTRIgine (LAMICTAL) 100 MG tablet Take 100 mg by mouth 2 (two) times daily.    . memantine (NAMENDA) 10 MG tablet Take 1 tablet (10 mg total) by mouth 2 (two) times daily. 180 tablet 1  . Multiple Vitamin (MULTIVITAMIN) tablet Take 1 tablet by mouth daily.    . Omega-3 Fatty Acids (FISH OIL) 1200 MG CAPS Take 2 capsules by mouth daily    . ramipril (ALTACE) 5 MG capsule Take 1 capsule (5 mg total) by mouth daily. 90 capsule 1  . sertraline (ZOLOFT) 25 MG tablet Take 1 tablet (25 mg total) by mouth at bedtime. 90 tablet 1  . tamsulosin (FLOMAX) 0.4 MG CAPS capsule Take 1 capsule (0.4 mg total) by mouth daily. 30 capsule 3   No current facility-administered medications for this visit.    Allergies  Allergen Reactions  . Rimantadine Nausea And Vomiting   Hallucinations    Family History  Problem Relation Age of Onset  . Alcohol abuse Father   . Heart disease Father     History   Social History  . Marital Status: Married    Spouse Name: N/A  . Number of Children: N/A  . Years of Education: N/A   Occupational History  . Not on file.   Social History Main Topics  . Smoking status: Former Games developermoker  . Smokeless tobacco: Never Used     Comment: smoked in college  . Alcohol Use: Yes  . Drug Use: Not on file  . Sexual Activity: Not on file   Other Topics Concern  . Not on file   Social History Narrative   Retired Presenter, broadcastingeducator/elementary school principal.   Recently moved with his wife (a retired Charity fundraiserN) from [place] to [place].   Two grown children- 3 grandchildren.     Constitutional: Denies fever, malaise, fatigue, headache or abrupt weight changes.  Respiratory: Denies difficulty breathing, shortness of breath, cough or sputum production.   Cardiovascular: Denies chest pain, chest tightness, palpitations or swelling in the hands or feet.  Gastrointestinal: Denies abdominal pain, bloating, constipation, diarrhea or blood in the stool.  GU: Pt reports frequency.  Denies urgency, pain with urination, burning sensation, blood in urine, odor or discharge.   No other specific complaints in a complete review of systems (except as listed in HPI above).  Objective:   Physical Exam   BP 130/76 mmHg  Pulse 62  Temp(Src) 97.8 F (36.6 C) (Oral)  Wt 186 lb (84.369 kg)  SpO2 98% Wt Readings from Last 3 Encounters:  06/25/14 186 lb (84.369 kg)  04/21/14 188 lb 8 oz (85.503 kg)  11/21/13 185 lb (83.915 kg)    General: Appears his stated age, well developed, well nourished in NAD.  Cardiovascular: Normal rate and rhythm. S1,S2 noted.  No murmur, rubs or gallops noted. No JVD or BLE edema. No carotid bruits noted. Pulmonary/Chest: Normal effort and positive vesicular breath sounds. No respiratory distress. No wheezes, rales or  ronchi noted.  Abdomen: Soft and nontender. Normal bowel sounds, no bruits noted. Rectal: External hemorrhoids noted. Good rectal tone. Prostate slightly enlarged but firm, no nodularity noted. Psychiatric: Demented, accompanied by his wife who is his main historian.  BMET    Component Value Date/Time   NA 136 05/07/2014 1134   NA 141 10/21/2012   K 3.9 05/07/2014 1134   CL 98 05/07/2014 1134   CO2 31 05/07/2014 1134   GLUCOSE 92 05/07/2014 1134   BUN 9 05/07/2014 1134   BUN 10 10/21/2012   CREATININE 0.86 05/07/2014 1134   CREATININE 0.9 10/21/2012   CALCIUM 9.2 05/07/2014 1134    Lipid Panel     Component Value Date/Time   CHOL 205* 04/21/2014 1209   TRIG 187.0* 04/21/2014 1209   HDL 61.70 04/21/2014 1209   CHOLHDL 3 04/21/2014 1209   VLDL 37.4 04/21/2014 1209   LDLCALC 106* 04/21/2014 1209    CBC    Component Value Date/Time   HGB 13.9 10/21/2012   HCT 42 10/21/2012   PLT 356 10/21/2012    Hgb A1C No results found for: HGBA1C      Assessment & Plan:   Enlarged prostate with urinary frequency/hesitancy:  Urinalysis: normal Will try daily Flomax His wife will watch for dizziness, worsening frequency, etc If Flomax ineffective, consider changing the HCTZ Make sure he is not drinking fluids after 7 pm  Follow up in 1 month if problem persist, sooner if worse

## 2014-06-25 NOTE — Progress Notes (Signed)
Pre visit review using our clinic review tool, if applicable. No additional management support is needed unless otherwise documented below in the visit note. 

## 2014-06-25 NOTE — Progress Notes (Signed)
Subjective:    Patient ID: Eugene Mcguire, male    DOB: 12-19-1945, 69 y.o.   MRN: [id number]  Urinary Tract Infection  Associated symptoms include frequency. Pertinent negatives include no chills, flank pain or urgency.   Eugene Mcguire is a 69 year old male who presents today with chief complaint of urinary frequency.  He has a history of Alzheimer's disease and is accompanied by his wife who provides the history. He is getting up 3-5 times per night to urinate and she has noticed that his stream is weaker than it used to be.  No dysuria or blood in urine.  His frequency has also increased during the day.      Review of Systems  Constitutional: Negative for fever, chills, activity change and fatigue.  HENT: Negative.   Respiratory: Negative for cough and shortness of breath.   Cardiovascular: Negative for chest pain, palpitations and leg swelling.  Genitourinary: Positive for frequency and decreased urine volume. Negative for dysuria, urgency, flank pain and difficulty urinating.  Musculoskeletal: Negative.   Skin: Negative.   Psychiatric/Behavioral:       Patient has Alzheimers   Past Medical History  Diagnosis Date  . Hyperlipidemia   . Hypertension   . Alzheimer's dementia 2003    visualspacial difficulties  . Colon polyps   . Seizure disorder    Family History  Problem Relation Age of Onset  . Alcohol abuse Father   . Heart disease Father    Current Outpatient Prescriptions on File Prior to Visit  Medication Sig Dispense Refill  . aspirin EC 81 MG tablet Take 81 mg by mouth daily.    Marland Kitchen atorvastatin (LIPITOR) 10 MG tablet Take 1 tablet (10 mg total) by mouth daily. 90 tablet 1  . donepezil (ARICEPT) 10 MG tablet Take 1 tablet (10 mg total) by mouth at bedtime. 90 tablet 1  . hydrochlorothiazide (HYDRODIURIL) 25 MG tablet Take 1 tablet (25 mg total) by mouth daily. 90 tablet 1  . lamoTRIgine (LAMICTAL) 100 MG tablet Take 100 mg by mouth 2 (two) times daily.    .  memantine (NAMENDA) 10 MG tablet Take 1 tablet (10 mg total) by mouth 2 (two) times daily. 180 tablet 1  . Multiple Vitamin (MULTIVITAMIN) tablet Take 1 tablet by mouth daily.    . Omega-3 Fatty Acids (FISH OIL) 1200 MG CAPS Take 2 capsules by mouth daily    . ramipril (ALTACE) 5 MG capsule Take 1 capsule (5 mg total) by mouth daily. 90 capsule 1  . sertraline (ZOLOFT) 25 MG tablet Take 1 tablet (25 mg total) by mouth at bedtime. 90 tablet 1   No current facility-administered medications on file prior to visit.       Objective:   Physical Exam  Constitutional: He appears well-developed and well-nourished.  HENT:  Head: Normocephalic and atraumatic.  Neck: Normal range of motion. Neck supple.  Cardiovascular: Normal rate, regular rhythm and normal heart sounds.   Pulmonary/Chest: Effort normal and breath sounds normal.  Genitourinary: Prostate normal. Prostate is not enlarged and not tender.  External hemorrhoids present  Musculoskeletal: Normal range of motion.  Neurological: He is alert.  Patient is alert, non oriented.  Speech is clear but not appropriate.   Skin: Skin is warm and dry.    BP 130/76 mmHg  Pulse 62  Temp(Src) 97.8 F (36.6 C) (Oral)  Wt 186 lb (84.369 kg)  SpO2 98%      Assessment & Plan:  1. Benign Prostatic  hyperplasia UA was normal. Flomax 0.4 mg po qd.  Follow up in 1 month.  Decrease fluids before bedtime.

## 2014-06-25 NOTE — Patient Instructions (Signed)
Benign Prostatic Hyperplasia An enlarged prostate (benign prostatic hyperplasia) is common in older men. You may experience the following:  Weak urine stream.  Dribbling.  Feeling like the bladder has not emptied completely.  Difficulty starting urination.  Getting up frequently at night to urinate.  Urinating more frequently during the day. HOME CARE INSTRUCTIONS  Monitor your prostatic hyperplasia for any changes. The following actions may help to alleviate any discomfort you are experiencing:  Give yourself time when you urinate.  Stay away from alcohol.  Avoid beverages containing caffeine, such as coffee, tea, and colas, because they can make the problem worse.  Avoid decongestants, antihistamines, and some prescription medicines that can make the problem worse.  Follow up with your health care provider for further treatment as recommended. SEEK MEDICAL CARE IF:  You are experiencing progressive difficulty voiding.  Your urine stream is progressively getting narrower.  You are awaking from sleep with the urge to void more frequently.  You are constantly feeling the need to void.  You experience loss of urine, especially in small amounts. SEEK IMMEDIATE MEDICAL CARE IF:   You develop increased pain with urination or are unable to urinate.  You develop severe abdominal pain, vomiting, a high fever, or fainting.  You develop back pain or blood in your urine. MAKE SURE YOU:   Understand these instructions.  Will watch your condition.  Will get help right away if you are not doing well or get worse. Document Released: 03/27/2005 Document Revised: 11/27/2012 Document Reviewed: 08/27/2012 ExitCare Patient Information 2015 [employer]. This information is not intended to replace advice given to you by your health care provider. Make sure you discuss any questions you have with your health care provider.  

## 2014-08-11 ENCOUNTER — Ambulatory Visit (INDEPENDENT_AMBULATORY_CARE_PROVIDER_SITE_OTHER): Payer: Medicare PPO | Admitting: Family Medicine

## 2014-08-11 ENCOUNTER — Encounter: Payer: Self-pay | Admitting: Family Medicine

## 2014-08-11 VITALS — BP 116/70 | HR 58 | Temp 97.9°F | Wt 185.8 lb

## 2014-08-11 DIAGNOSIS — G309 Alzheimer's disease, unspecified: Secondary | ICD-10-CM | POA: Diagnosis not present

## 2014-08-11 DIAGNOSIS — R351 Nocturia: Secondary | ICD-10-CM

## 2014-08-11 DIAGNOSIS — R350 Frequency of micturition: Secondary | ICD-10-CM | POA: Diagnosis not present

## 2014-08-11 DIAGNOSIS — G40909 Epilepsy, unspecified, not intractable, without status epilepticus: Secondary | ICD-10-CM

## 2014-08-11 DIAGNOSIS — F0280 Dementia in other diseases classified elsewhere without behavioral disturbance: Secondary | ICD-10-CM

## 2014-08-11 LAB — POCT URINALYSIS DIPSTICK
BILIRUBIN UA: NEGATIVE
GLUCOSE UA: NEGATIVE
KETONES UA: NEGATIVE
Leukocytes, UA: NEGATIVE
Nitrite, UA: NEGATIVE
Protein, UA: NEGATIVE
RBC UA: NEGATIVE
Spec Grav, UA: 1.015
Urobilinogen, UA: 0.2
pH, UA: 6

## 2014-08-11 NOTE — Progress Notes (Signed)
Subjective:   Patient ID: Eugene Mcguire, male    DOB: 02/23/1946, 69 y.o.   MRN: [id number]  Eugene DarlingWilliam Lehmkuhl is a pleasant 69 y.o. year old male who presents to clinic today with his wife for Urinary Frequency  on 08/11/2014  HPI:  Rosendo GrosSaw Regina Baity for increased urinary frequency and hesitancy for 1 months duration on 06/25/14. Note reviewed. UA neg and started on Flomax.  His wife has noticed that it has helped at night.  Increased volumes in the urinal and not waking up as frequently at night. She feels it has not helped during the day.  Sometimes feels he has to urinate as frequently as every 30 minutes.  Does get a good volume out usually- sometimes more than others.  Denies dysuria, hematuria or back pain.  No incontinence. Current Outpatient Prescriptions on File Prior to Visit  Medication Sig Dispense Refill  . aspirin EC 81 MG tablet Take 81 mg by mouth daily.    Marland Kitchen. atorvastatin (LIPITOR) 10 MG tablet Take 1 tablet (10 mg total) by mouth daily. 90 tablet 1  . donepezil (ARICEPT) 10 MG tablet Take 1 tablet (10 mg total) by mouth at bedtime. 90 tablet 1  . hydrochlorothiazide (HYDRODIURIL) 25 MG tablet Take 1 tablet (25 mg total) by mouth daily. 90 tablet 1  . lamoTRIgine (LAMICTAL) 100 MG tablet Take 100 mg by mouth 2 (two) times daily.    . memantine (NAMENDA) 10 MG tablet Take 1 tablet (10 mg total) by mouth 2 (two) times daily. 180 tablet 1  . Multiple Vitamin (MULTIVITAMIN) tablet Take 1 tablet by mouth daily.    . Omega-3 Fatty Acids (FISH OIL) 1200 MG CAPS Take 2 capsules by mouth daily    . ramipril (ALTACE) 5 MG capsule Take 1 capsule (5 mg total) by mouth daily. 90 capsule 1  . sertraline (ZOLOFT) 25 MG tablet Take 1 tablet (25 mg total) by mouth at bedtime. 90 tablet 1  . tamsulosin (FLOMAX) 0.4 MG CAPS capsule Take 1 capsule (0.4 mg total) by mouth daily. 30 capsule 3   No current facility-administered medications on file prior to visit.    Allergies    Allergen Reactions  . Rimantadine Nausea And Vomiting    Hallucinations    Past Medical History  Diagnosis Date  . Hyperlipidemia   . Hypertension   . Alzheimer's dementia 2003    visualspacial difficulties  . Colon polyps   . Seizure disorder     Past Surgical History  Procedure Laterality Date  . Cholecystectomy      Family History  Problem Relation Age of Onset  . Alcohol abuse Father   . Heart disease Father     History   Social History  . Marital Status: Married    Spouse Name: N/A  . Number of Children: N/A  . Years of Education: N/A   Occupational History  . Not on file.   Social History Main Topics  . Smoking status: Former Games developermoker  . Smokeless tobacco: Never Used     Comment: smoked in college  . Alcohol Use: Yes  . Drug Use: Not on file  . Sexual Activity: Not on file   Other Topics Concern  . Not on file   Social History Narrative   Retired Presenter, broadcastingeducator/elementary school principal.   Recently moved with his wife (a retired Charity fundraiserN) from [place] to [place].   Two grown children- 3 grandchildren.   The PMH, PSH, Social History, Family History, Medications,  and allergies have been reviewed in [facility], and have been updated if relevant.  Review of Systems  Constitutional: Negative.   Gastrointestinal: Negative.   Genitourinary: Positive for urgency and frequency. Negative for hematuria, flank pain and enuresis.  Musculoskeletal: Negative.   All other systems reviewed and are negative.      Objective:    BP 116/70 mmHg  Pulse 58  Temp(Src) 97.9 F (36.6 C) (Oral)  Wt 185 lb 12 oz (84.256 kg)  SpO2 98%   Physical Exam  Constitutional: He is oriented to person, place, and time. He appears well-developed and well-nourished.  Pleasant, conversant   HENT:  Head: Normocephalic.  Cardiovascular: Normal rate.   Pulmonary/Chest: Effort normal.  Neurological: He is alert and oriented to person, place, and time. No cranial nerve deficit.  Skin:  Skin is warm and dry.  Psychiatric: He has a normal mood and affect. His behavior is normal. Judgment and thought content normal.  Nursing note and vitals reviewed.         Assessment & Plan:   Urinary frequency - Plan: Urinalysis Dipstick, Ambulatory referral to Urology  Nocturia - Plan: Ambulatory referral to Urology  Alzheimer disease  Seizure disorder No Follow-up on file.

## 2014-08-11 NOTE — Assessment & Plan Note (Signed)
Persistent. Discussed with pt and his wife. Flomax helping with symptoms of nocturia without adverse symptoms, so will continue for now. Refer to urology for further work up as this is impacting his quality of life. ?neuronal process/bladder spasms?

## 2014-08-11 NOTE — Progress Notes (Signed)
Pre visit review using our clinic review tool, if applicable. No additional management support is needed unless otherwise documented below in the visit note. 

## 2014-09-06 ENCOUNTER — Encounter: Payer: Self-pay | Admitting: Family Medicine

## 2014-10-07 ENCOUNTER — Ambulatory Visit: Payer: Self-pay

## 2014-10-20 ENCOUNTER — Ambulatory Visit (INDEPENDENT_AMBULATORY_CARE_PROVIDER_SITE_OTHER): Payer: Medicare PPO | Admitting: Family Medicine

## 2014-10-20 ENCOUNTER — Encounter: Payer: Self-pay | Admitting: Family Medicine

## 2014-10-20 VITALS — BP 132/68 | HR 71 | Temp 98.1°F | Wt 180.0 lb

## 2014-10-20 DIAGNOSIS — E785 Hyperlipidemia, unspecified: Secondary | ICD-10-CM

## 2014-10-20 DIAGNOSIS — G309 Alzheimer's disease, unspecified: Secondary | ICD-10-CM

## 2014-10-20 DIAGNOSIS — I10 Essential (primary) hypertension: Secondary | ICD-10-CM

## 2014-10-20 DIAGNOSIS — Z111 Encounter for screening for respiratory tuberculosis: Secondary | ICD-10-CM

## 2014-10-20 DIAGNOSIS — K5900 Constipation, unspecified: Secondary | ICD-10-CM

## 2014-10-20 DIAGNOSIS — G40909 Epilepsy, unspecified, not intractable, without status epilepticus: Secondary | ICD-10-CM | POA: Diagnosis not present

## 2014-10-20 DIAGNOSIS — R197 Diarrhea, unspecified: Secondary | ICD-10-CM

## 2014-10-20 DIAGNOSIS — F0280 Dementia in other diseases classified elsewhere without behavioral disturbance: Secondary | ICD-10-CM

## 2014-10-20 NOTE — Assessment & Plan Note (Signed)
Stable, followed by neuro.  No recent changes to rx.

## 2014-10-20 NOTE — Progress Notes (Signed)
Subjective:    Patient ID: Eugene Mcguire, male    DOB: [birth year], 69 y.o.   MRN: [id number]  HPI  Very pleasant 69 yo male here with his wife for follow up and to discuss diarrhea and constipation.   Has form to fill out for adult day care- needs TB skin test as well.  He att[facility] per [facility].  Really enjoys this--especially deserts they serve there.  He says this is now his favorite place to go.  Alzheimer's disease- visual spatial variant.  Was followed a couple of times by Dr[facility] Shannan [facility]ittmatter at [facility] but has since transferred care to Dr[facility] Malvin JohnsPotter at [facility]. Seeing him for follow up in 1-2 weeks. No recent changes to rx. [facility] att[facility].    Symptoms have been progressing although not rapidly- diagnosed in 2003.  On Namenda 10 mg twice daily and Aricept 10 mg daily.  No new rxs.  Does have a long history of ntermittent constipation but now his wife is noticing that every 3-5 days, he will have an episode of firm "balls" followed by very watery stools.  She has has added a probiotic (started in 08/2014) but has not noticed much improvement with this.  No fevers.  No blood in his stool.  No recent abx.  She has not been giving him any power pudding or laxatives.  HTN- on Ramipril 5 mg daily and HCTZ 25 mg daily.   Lab Results  Component Value Date   CREATININE 0.86 05/07/2014   HLD-  On Lipitor 10 mg daily. Lab Results  Component Value Date   CHOL 205* 04/21/2014   HDL 61.70 04/21/2014   LDLCALC 106* 04/21/2014   TRIG 187.0* 04/21/2014   CHOLHDL 3 04/21/2014     Patient Active Problem List   Diagnosis Date Noted  . Urinary frequency 08/11/2014  . Constipation 04/21/2014  . OSA on CPAP 06/19/2013  . Nocturia 05/22/2013  . HTN (hypertension) 12/03/2012  . Hyperlipidemia   . Alzheimer disease   . Seizure disorder    Past Medical History   Diagnosis Date  . Hyperlipidemia   . Hypertension   . Alzheimer's dementia 2003    visualspacial difficulties  . Colon polyps   . Seizure disorder    Past Surgical History  Procedure Laterality Date  . Cholecystectomy     History  Substance Use Topics  . Smoking status: Former Games developermoker  . Smokeless tobacco: Never Used     Comment: smoked in college  . Alcohol Use: Yes   Family History  Problem Relation Age of Onset  . Alcohol abuse Father   . Heart disease Father    Allergies  Allergen Reactions  . Rimantadine Nausea And Vomiting    Hallucinations   Current Outpatient Prescriptions on File Prior to Visit  Medication Sig Dispense Refill  . aspirin EC 81 MG tablet Take 81 mg by mouth daily.    Marland Kitchen. atorvastatin (LIPITOR) 10 MG tablet Take 1 tablet (10 mg total) by mouth daily. 90 tablet 1  . donepezil (ARICEPT) 10 MG tablet Take 1 tablet (10 mg total) by mouth at bedtime. 90 tablet 1  . hydrochlorothiazide (HYDRODIURIL) 25 MG tablet Take 1 tablet (25 mg total) by mouth daily. 90 tablet 1  . lamoTRIgine (LAMICTAL) 100 MG tablet Take 100 mg by mouth 2 (two) times daily.    .Marland Kitchen  memantine (NAMENDA) 10 MG tablet Take 1 tablet (10 mg total) by mouth 2 (two) times daily. 180 tablet 1  . Multiple Vitamin (MULTIVITAMIN) tablet Take 1 tablet by mouth daily.    . Omega-3 Fatty Acids (FISH OIL) 1200 MG CAPS Take 2 capsules by mouth daily    . ramipril (ALTACE) 5 MG capsule Take 1 capsule (5 mg total) by mouth daily. 90 capsule 1  . sertraline (ZOLOFT) 25 MG tablet Take 1 tablet (25 mg total) by mouth at bedtime. 90 tablet 1  . tamsulosin (FLOMAX) 0.4 MG CAPS capsule Take 1 capsule (0.4 mg total) by mouth daily. 30 capsule 3   No current facility-administered medications on file prior to visit.   The PMH, PSH, Social History, Family History, Medications, and allergies have been reviewed in [facility], and have been updated if relevant.     Review of Systems  Constitutional: Negative.   HENT:  Negative.   Respiratory: Negative.   Gastrointestinal: Positive for diarrhea and constipation. Negative for nausea, vomiting, abdominal pain, blood in stool, abdominal distention and anal bleeding.  Endocrine: Negative.   Genitourinary: Negative.   Musculoskeletal: Negative.   Skin: Negative.   Neurological: Negative.   Hematological: Negative.   Psychiatric/Behavioral: Negative.   All other systems reviewed and are negative.   Wt Readings from Last 3 Encounters:  10/20/14 180 lb (81.647 kg)  08/11/14 185 lb 12 oz (84.256 kg)  06/25/14 186 lb (84.369 kg)    Objective:   Physical Exam  Constitutional: He is oriented to person, place, and time. He appears well-developed and well-nourished. No distress.  HENT:  Head: Normocephalic.  Cardiovascular: Normal rate.   Pulmonary/Chest: Effort normal.  Abdominal: Soft. He exhibits no distension.  Musculoskeletal: He exhibits no edema.  Neurological: He is alert and oriented to person, place, and time. No cranial nerve deficit.  Skin: Skin is warm and dry.  Psychiatric: He has a normal mood and affect. His behavior is normal. Judgment and thought content normal.  Nursing note and vitals reviewed.  BP 132/68 mmHg  Pulse 71  Temp(Src) 98.1 F (36.7 C) (Oral)  Wt 180 lb (81.647 kg)  SpO2 95% Wt Readings from Last 3 Encounters:  10/20/14 180 lb (81.647 kg)  08/11/14 185 lb 12 oz (84.256 kg)  06/25/14 186 lb (84.369 kg)          Assessment & Plan:

## 2014-10-20 NOTE — Assessment & Plan Note (Signed)
Stable on current rx. No changes made. 

## 2014-10-20 NOTE — Progress Notes (Signed)
Pre visit review using our clinic review tool, if applicable. No additional management support is needed unless otherwise documented below in the visit note. 

## 2014-10-20 NOTE — Assessment & Plan Note (Signed)
Now alternating with watery stools.  ? Stool ball and stool is leaking around it but given amount of stool and duration of symptoms, this seems a little less likely.  ?due to neurological condition. Advised keeping food journal.  Wife wants celiac ruled out- strong FH. I suggest also checking for C diff since [facility].  She wants to wait until after results of celiac. Orders Placed This Encounter  Procedures  . Celiac panel 10  . PPD

## 2014-10-21 LAB — CELIAC PANEL 10
ENDOMYSIAL SCREEN: NEGATIVE
GLIADIN IGA: 4 U (ref <20)
Gliadin IgG: 2 Units (ref <20)
IGA: 255 mg/dL (ref 68–379)
TISSUE TRANSGLUT AB: 1 U/mL (ref <6)
Tissue Transglutaminase Ab, IgA: 1 U/mL (ref <4)

## 2014-10-22 LAB — TB SKIN TEST: TB Skin Test: NEGATIVE

## 2014-10-23 ENCOUNTER — Other Ambulatory Visit: Payer: Self-pay | Admitting: Family Medicine

## 2014-10-23 ENCOUNTER — Encounter: Payer: Self-pay | Admitting: Family Medicine

## 2014-10-23 DIAGNOSIS — R197 Diarrhea, unspecified: Secondary | ICD-10-CM

## 2014-10-31 ENCOUNTER — Encounter: Payer: Self-pay | Admitting: Family Medicine

## 2014-10-31 DIAGNOSIS — R197 Diarrhea, unspecified: Secondary | ICD-10-CM

## 2014-11-02 ENCOUNTER — Encounter: Payer: Self-pay | Admitting: Family Medicine

## 2014-11-09 NOTE — Addendum Note (Signed)
Addended by: Alvina Chou on: 11/09/2014 12:19 PM   Modules accepted: Orders

## 2014-11-10 LAB — C. DIFFICILE GDH AND TOXIN A/B
C. difficile GDH: NOT DETECTED
C. difficile Toxin A/B: NOT DETECTED

## 2014-11-13 ENCOUNTER — Other Ambulatory Visit: Payer: Self-pay | Admitting: Family Medicine

## 2014-12-04 ENCOUNTER — Other Ambulatory Visit: Payer: Self-pay | Admitting: Family Medicine

## 2014-12-09 ENCOUNTER — Telehealth: Payer: Self-pay

## 2014-12-09 NOTE — Telephone Encounter (Signed)
Returned Ms. Bedore's call. Spoke at length about his options.  She will keep me updated.

## 2014-12-09 NOTE — Telephone Encounter (Signed)
Pt's wife said pt continues with cycle 2-3 days with small BMs and 4th day has large flaky,watery BMs. This cycle or pattern has been going on for 2-3 months. Pt seen 10/20/14. Mrs Gervase wants comfort care but does not want a lot of testing done. Celiac was neg. Food journal for last 10 days; has tried to keep pt off dairy products for last 10 days but do not see change in bowel habits. Last episode of diarrhea was this morning. Mrs Mander wants to know what point will pallative care be needed or possible. Mrs Rueb wants to know if pt needs to take the Lipitor since cholesterol has been normal for so long.  Mrs Moyano request cb from Dr Dayton Martes if possible.

## 2014-12-24 ENCOUNTER — Telehealth: Payer: Self-pay | Admitting: Family Medicine

## 2014-12-24 NOTE — Telephone Encounter (Signed)
Patient Name: Eugene Mcguire  DOB: 1945-04-12    Initial Comment Caller states her husband has Alzheimer's but seems more confused now   Nurse Assessment  Nurse: Sherilyn Cooter, RN, Thurmond Butts Date/Time Lamount Cohen Time): 12/24/2014 2:43:56 PM  Confirm and document reason for call. If symptomatic, describe symptoms. ---Caller states that her husband has Alzheimers and his confusion has been worse for the last 2 days. He is drinking fluids and urinating on a regular basis. Denies fever. Denies head injury.  Has the patient traveled out of the country within the last 30 days? ---No  Does the patient require triage? ---Yes  Related visit to physician within the last 2 weeks? ---No  Does the PT have any chronic conditions? (i.e. diabetes, asthma, etc.) ---Yes  List chronic conditions. ---Alzheimers, HTN, BPH,     Guidelines    Guideline Title Affirmed Question Affirmed Notes  Confusion - Delirium [1] Longstanding confusion (e.g., dementia, stroke) AND [2] worsening    Final Disposition User   See Physician within 4 Hours (or PCP triage) Sherilyn Cooter, RN, Thurmond Butts    Comments  Caller declined appointment at this time. She will keep a watch on his and if it continues to get worse, she will get back in touch with Dr. Dayton Martes.   Referrals  REFERRED TO PCP OFFICE   Disagree/Comply: Disagree  Disagree/Comply Reason: Disagree with instructions

## 2014-12-24 NOTE — Telephone Encounter (Signed)
If symptoms persist, will need to make appt

## 2014-12-24 NOTE — Telephone Encounter (Signed)
Sending note to Dr Dayton Martes (who is out of office) and Nicki Reaper NP.

## 2014-12-25 ENCOUNTER — Encounter: Payer: Self-pay | Admitting: Internal Medicine

## 2014-12-25 ENCOUNTER — Ambulatory Visit (INDEPENDENT_AMBULATORY_CARE_PROVIDER_SITE_OTHER): Payer: Medicare PPO | Admitting: Internal Medicine

## 2014-12-25 VITALS — BP 140/70 | HR 54 | Temp 98.4°F | Wt 174.0 lb

## 2014-12-25 DIAGNOSIS — G309 Alzheimer's disease, unspecified: Secondary | ICD-10-CM | POA: Diagnosis not present

## 2014-12-25 DIAGNOSIS — F0280 Dementia in other diseases classified elsewhere without behavioral disturbance: Secondary | ICD-10-CM

## 2014-12-25 DIAGNOSIS — R350 Frequency of micturition: Secondary | ICD-10-CM | POA: Diagnosis not present

## 2014-12-25 LAB — POCT URINALYSIS DIPSTICK
BILIRUBIN UA: NEGATIVE
Blood, UA: NEGATIVE
GLUCOSE UA: NEGATIVE
KETONES UA: NEGATIVE
LEUKOCYTES UA: NEGATIVE
NITRITE UA: NEGATIVE
Protein, UA: NEGATIVE
Spec Grav, UA: 1.015
Urobilinogen, UA: NEGATIVE
pH, UA: 7

## 2014-12-25 NOTE — Assessment & Plan Note (Signed)
No evidence of UTI Reassured wife

## 2014-12-25 NOTE — Progress Notes (Signed)
Subjective:    Patient ID: Eugene Mcguire, male    DOB: [birth year], 69 y.o.   MRN: [id number]  HPI Here with wife due to change in status  Wife has noticed steady overall decline Seems to be more striking in past week Wife wanted to be sure he didn't have a UTI  Does have known BPH Has seen Dr Apolinar Junes  He does have urgency No dysuria or hematuria  Continues at the [facility] They have noticed a decline also  Needs supervision and step by step cueing for shower and dressing Needs help getting to the toilet--mostly due to vision/spatial problems  Has loose stools still Formerly constipated More noticeable since last year Several days with small stools--then large loose stool  Current Outpatient Prescriptions on File Prior to Visit  Medication Sig Dispense Refill  . aspirin EC 81 MG tablet Take 81 mg by mouth daily.    Marland Kitchen donepezil (ARICEPT) 10 MG tablet TAKE ONE TABLET BY MOUTH AT BEDTIME 90 tablet 0  . finasteride (PROSCAR) 5 MG tablet Take 5 mg by mouth daily.    . hydrochlorothiazide (HYDRODIURIL) 25 MG tablet TAKE ONE TABLET BY MOUTH ONCE DAILY 90 tablet 0  . lamoTRIgine (LAMICTAL) 100 MG tablet Take 100 mg by mouth 2 (two) times daily.    . memantine (NAMENDA) 10 MG tablet TAKE ONE TABLET BY MOUTH TWICE DAILY 180 tablet 0  . Multiple Vitamin (MULTIVITAMIN) tablet Take 1 tablet by mouth daily.    . Omega-3 Fatty Acids (FISH OIL) 1200 MG CAPS Take 2 capsules by mouth daily    . ramipril (ALTACE) 5 MG capsule TAKE ONE CAPSULE BY MOUTH ONCE DAILY 90 capsule 0  . sertraline (ZOLOFT) 25 MG tablet TAKE ONE TABLET BY MOUTH AT BEDTIME 90 tablet 0  . tamsulosin (FLOMAX) 0.4 MG CAPS capsule Take 1 capsule (0.4 mg total) by mouth daily. 30 capsule 3   No current facility-administered medications on file prior to visit.    Allergies  Allergen Reactions  . Rimantadine Nausea And Vomiting    Hallucinations    Past Medical History  Diagnosis Date  . Hyperlipidemia   .  Hypertension   . Alzheimer's dementia 2003    visualspacial difficulties  . Colon polyps   . Seizure disorder     Past Surgical History  Procedure Laterality Date  . Cholecystectomy      Family History  Problem Relation Age of Onset  . Alcohol abuse Father   . Heart disease Father     Social History   Social History  . Marital Status: Married    Spouse Name: N/A  . Number of Children: N/A  . Years of Education: N/A   Occupational History  . Not on file.   Social History Main Topics  . Smoking status: Former Games developer  . Smokeless tobacco: Never Used     Comment: smoked in college  . Alcohol Use: Yes  . Drug Use: Not on file  . Sexual Activity: Not on file   Other Topics Concern  . Not on file   Social History Narrative   Retired Presenter, broadcasting school principal.   Recently moved with his wife (a retired Charity fundraiser) from [place] to [place].   Two grown children- 3 grandchildren.   Review of Systems Still has some AM jerks at times Sleeps okay Appetite is okay    Objective:   Physical Exam  Constitutional: He appears well-developed. No distress.  Neck: Normal range of motion. Neck supple.  No thyromegaly present.  Cardiovascular: Normal rate, regular rhythm and normal heart sounds.  Exam reveals no gallop.   No murmur heard. Pulmonary/Chest: Breath sounds normal. No respiratory distress. He has no wheezes. He has no rales.  Abdominal: Soft. There is no tenderness.  Lymphadenopathy:    He has no cervical adenopathy.  Neurological:  Needs guidance to walk--but normal steps Normal tone No focal weakness  Psychiatric:  Passive but does respond to questions          Assessment & Plan:

## 2014-12-25 NOTE — Progress Notes (Signed)
Pre visit review using our clinic review tool, if applicable. No additional management support is needed unless otherwise documented below in the visit note. 

## 2014-12-25 NOTE — Assessment & Plan Note (Signed)
Has had steady decline but more noticeable lately No evidence of delirium Shakes--but no clear seizure No action for now Will send to Dr A for review

## 2014-12-25 NOTE — Telephone Encounter (Signed)
Spoke to pts wife; scheduled with Dr Alphonsus Sias

## 2014-12-30 ENCOUNTER — Telehealth: Payer: Self-pay | Admitting: Family Medicine

## 2014-12-30 NOTE — Telephone Encounter (Signed)
Pt has appt 12/31/14 at 10:45 with Dr Dayton Martes.

## 2014-12-30 NOTE — Telephone Encounter (Signed)
PLEASE NOTE: All timestamps contained within this report are represented as [place] Standard Time. CONFIDENTIALTY NOTICE: This fax transmission is intended only for the addressee. It contains information that is legally privileged, confidential or otherwise protected from use or disclosure. If you are not the intended recipient, you are strictly prohibited from reviewing, disclosing, copying using or disseminating any of this information or taking any action in reliance on or regarding this information. If you have received this fax in error, please notify us immediately by telephone so that we can arrange for its return to [place]. Phone: [phone], Toll-Free: [phone], Fax: [phone] Page: 1 of 1 Call Id: [id number] [facility] Day - Client TELEPHONE ADVICE RECORD [facility] Patient Name: Eugene Mcguire DOB: 08/27/1945 Initial Comment caller states her husband has alzheimers - has told her when he is walking and turning or when he goes from sitting to standing he feels dizzy. Nurse Assessment Nurse: Charna Elizabeth, RN, Cathy Date/Time (Eastern Time): 12/30/2014 10:52:56 AM Confirm and document reason for call. If symptomatic, describe symptoms. ---Caller states her husband developed dizziness several days ago that is worse today. No known injury in the past 3 days. No drainage from his ears. No fever. No severe breathing difficulty. Alert and responsive. Has the patient traveled out of the country within the last 30 days? ---No Does the patient require triage? ---Yes Related visit to physician within the last 2 weeks? ---No Does the PT have any chronic conditions? (i.e. diabetes, asthma, etc.) ---Yes List chronic conditions. ---Alzheimer's, High Blood Pressure, Seizures Guidelines Guideline Title Affirmed Question Affirmed Notes Dizziness - Lightheadedness [1] MODERATE dizziness (e.g., interferes with normal activities) AND [2] has NOT  been evaluated by physician for this (Exception: dizziness caused by heat exposure, sudden standing, or poor fluid intake) Final Disposition User See Physician within 24 Hours [place], RN, Lynden Ang Comments Scheduled for 10:45 am appointment 12/31/14. Referrals REFERRED TO PCP OFFICE Disagree/Comply: Comply

## 2014-12-31 ENCOUNTER — Ambulatory Visit (INDEPENDENT_AMBULATORY_CARE_PROVIDER_SITE_OTHER): Payer: Medicare PPO | Admitting: Family Medicine

## 2014-12-31 VITALS — BP 122/74 | HR 63 | Temp 98.3°F | Wt 173.5 lb

## 2014-12-31 DIAGNOSIS — R42 Dizziness and giddiness: Secondary | ICD-10-CM | POA: Insufficient documentation

## 2014-12-31 DIAGNOSIS — H6123 Impacted cerumen, bilateral: Secondary | ICD-10-CM

## 2014-12-31 DIAGNOSIS — H6120 Impacted cerumen, unspecified ear: Secondary | ICD-10-CM | POA: Insufficient documentation

## 2014-12-31 NOTE — Assessment & Plan Note (Signed)
New- consistent with BPV, mild symptoms. Discussed precautions, no rxs given. WIfe is rx. Call or return to clinic prn if these symptoms worsen or fail to improve as anticipated. The patient indicates understanding of these issues and agrees with the plan.

## 2014-12-31 NOTE — Progress Notes (Signed)
Pre visit review using our clinic review tool, if applicable. No additional management support is needed unless otherwise documented below in the visit note. 

## 2014-12-31 NOTE — Patient Instructions (Addendum)
To keep wax from building up :  Use dilute hydrogen peroxide (1:1 with water) a few drops in right ear to help break down ear wax.  Vertigo Vertigo means you feel like you or your surroundings are moving when they are not. Vertigo can be dangerous if it occurs when you are at work, driving, or performing difficult activities.  CAUSES  Vertigo occurs when there is a conflict of signals sent to your brain from the visual and sensory systems in your body. There are many different causes of vertigo, including:  Infections, especially in the inner ear.  A bad reaction to a drug or misuse of alcohol and medicines.  Withdrawal from drugs or alcohol.  Rapidly changing positions, such as lying down or rolling over in bed.  A migraine headache.  Wax or fluid build up behind your ear SYMPTOMS  You may feel as though the world is spinning around or you are falling to the ground. Because your balance is upset, vertigo can cause nausea and vomiting. You may have involuntary eye movements (nystagmus). DIAGNOSIS  Vertigo is usually diagnosed by physical exam. If the cause of your vertigo is unknown, your caregiver may perform imaging tests, such as an MRI scan (magnetic resonance imaging). TREATMENT  Most cases of vertigo resolve on their own, without treatment. Depending on the cause, your caregiver may prescribe certain medicines. If your vertigo is related to body position issues, your caregiver may recommend movements or procedures to correct the problem. In rare cases, if your vertigo is caused by certain inner ear problems, you may need surgery. HOME CARE INSTRUCTIONS   Follow your caregiver's instructions.  Avoid driving.  Avoid operating heavy machinery.  Avoid performing any tasks that would be dangerous to you or others during a vertigo episode.  Tell your caregiver if you notice that certain medicines seem to be causing your vertigo. Some of the medicines used to treat vertigo episodes  can actually make them worse in some people. SEEK IMMEDIATE MEDICAL CARE IF:   Your medicines do not relieve your vertigo or are making it worse.  You develop problems with talking, walking, weakness, or using your arms, hands, or legs.  You develop severe headaches.  Your nausea or vomiting continues or gets worse.  You develop visual changes.  A family member notices behavioral changes.  Your condition gets worse. MAKE SURE YOU:  Understand these instructions.  Will watch your condition.  Will get help right away if you are not doing well or get worse. Document Released: 01/04/2005 Document Revised: 06/19/2011 Document Reviewed: 10/13/2010 [facility] Patient Information 2015 [place], [place]. This information is not intended to replace advice given to you by your health care provider. Make sure you discuss any questions you have with your health care provider.

## 2014-12-31 NOTE — Progress Notes (Signed)
Subjective:   Patient ID: Eugene Mcguire, male    DOB: 1945-05-17, 69 y.o.   MRN: [id number]  Eugene Mcguire is a pleasant 69 y.o. year old male who presents to clinic today with his wife for Dizziness  on 12/31/2014  HPI:  Past few days, complaining of dizziness when walking and turning left or right.  Also having symptoms when riding in the car while car is turning.  Some nausea, no vomiting. No headaches or photophobia. Laying still makes it better.  Has never had anything like this before.  Current Outpatient Prescriptions on File Prior to Visit  Medication Sig Dispense Refill  . aspirin EC 81 MG tablet Take 81 mg by mouth daily.    Marland Kitchen donepezil (ARICEPT) 10 MG tablet TAKE ONE TABLET BY MOUTH AT BEDTIME 90 tablet 0  . finasteride (PROSCAR) 5 MG tablet Take 5 mg by mouth daily.    . hydrochlorothiazide (HYDRODIURIL) 25 MG tablet TAKE ONE TABLET BY MOUTH ONCE DAILY 90 tablet 0  . lamoTRIgine (LAMICTAL) 100 MG tablet Take 100 mg by mouth 2 (two) times daily.    . memantine (NAMENDA) 10 MG tablet TAKE ONE TABLET BY MOUTH TWICE DAILY 180 tablet 0  . Multiple Vitamin (MULTIVITAMIN) tablet Take 1 tablet by mouth daily.    . Omega-3 Fatty Acids (FISH OIL) 1200 MG CAPS Take 2 capsules by mouth daily    . ramipril (ALTACE) 5 MG capsule TAKE ONE CAPSULE BY MOUTH ONCE DAILY 90 capsule 0  . sertraline (ZOLOFT) 25 MG tablet TAKE ONE TABLET BY MOUTH AT BEDTIME 90 tablet 0  . tamsulosin (FLOMAX) 0.4 MG CAPS capsule Take 1 capsule (0.4 mg total) by mouth daily. 30 capsule 3   No current facility-administered medications on file prior to visit.    Allergies  Allergen Reactions  . Rimantadine Nausea And Vomiting    Hallucinations    Past Medical History  Diagnosis Date  . Hyperlipidemia   . Hypertension   . Alzheimer's dementia 2003    visualspacial difficulties  . Colon polyps   . Seizure disorder     Past Surgical History  Procedure Laterality Date  . Cholecystectomy        Family History  Problem Relation Age of Onset  . Alcohol abuse Father   . Heart disease Father     Social History   Social History  . Marital Status: Married    Spouse Name: N/A  . Number of Children: N/A  . Years of Education: N/A   Occupational History  . Not on file.   Social History Main Topics  . Smoking status: Former Games developer  . Smokeless tobacco: Never Used     Comment: smoked in college  . Alcohol Use: Yes  . Drug Use: Not on file  . Sexual Activity: Not on file   Other Topics Concern  . Not on file   Social History Narrative   Retired Presenter, broadcasting school principal.   Recently moved with his wife (a retired Charity fundraiser) from [place] to [place].   Two grown children- 3 grandchildren.   The PMH, PSH, Social History, Family History, Medications, and allergies have been reviewed in [facility], and have been updated if relevant.   Review of Systems  Constitutional: Negative.   HENT: Negative for ear discharge, ear pain, sneezing, sore throat, tinnitus and trouble swallowing.   Respiratory: Negative.   Cardiovascular: Negative.   Neurological: Positive for dizziness. Negative for tremors, seizures, syncope, facial asymmetry, speech difficulty,  weakness, light-headedness, numbness and headaches.  All other systems reviewed and are negative.      Objective:    BP 122/74 mmHg  Pulse 63  Temp(Src) 98.3 F (36.8 C) (Oral)  Wt 173 lb 8 oz (78.699 kg)  SpO2 98%   Physical Exam  Constitutional: He is oriented to person, place, and time. He appears well-developed and well-nourished. No distress.  HENT:  Head: Normocephalic.  + cerumen impaction bilaterally  Eyes: Pupils are equal, round, and reactive to light.  Neck: Normal range of motion.  Cardiovascular: Normal rate.   Neurological: He is alert and oriented to person, place, and time.  + dix hallpike right  Skin: Skin is warm and dry.  Psychiatric: He has a normal mood and affect. His behavior is  normal. Judgment and thought content normal.  Nursing note and vitals reviewed.         Assessment & Plan:   Dizziness and giddiness  Cerumen impaction, bilateral No Follow-up on file.

## 2014-12-31 NOTE — Assessment & Plan Note (Signed)
New- and likely triggering BPV symptoms.   Wax is removed by syringing and manual debridement. Instructions for home care to prevent wax buildup are given.

## 2015-02-18 ENCOUNTER — Other Ambulatory Visit: Payer: Self-pay | Admitting: Family Medicine

## 2015-03-09 ENCOUNTER — Ambulatory Visit (INDEPENDENT_AMBULATORY_CARE_PROVIDER_SITE_OTHER): Payer: Medicare PPO | Admitting: Family Medicine

## 2015-03-09 ENCOUNTER — Encounter: Payer: Self-pay | Admitting: Family Medicine

## 2015-03-09 VITALS — BP 122/68 | HR 62 | Temp 98.2°F | Wt 170.0 lb

## 2015-03-09 DIAGNOSIS — I10 Essential (primary) hypertension: Secondary | ICD-10-CM

## 2015-03-09 DIAGNOSIS — G309 Alzheimer's disease, unspecified: Secondary | ICD-10-CM | POA: Diagnosis not present

## 2015-03-09 DIAGNOSIS — E785 Hyperlipidemia, unspecified: Secondary | ICD-10-CM | POA: Diagnosis not present

## 2015-03-09 DIAGNOSIS — K591 Functional diarrhea: Secondary | ICD-10-CM | POA: Diagnosis not present

## 2015-03-09 DIAGNOSIS — G40909 Epilepsy, unspecified, not intractable, without status epilepticus: Secondary | ICD-10-CM | POA: Diagnosis not present

## 2015-03-09 DIAGNOSIS — K5900 Constipation, unspecified: Secondary | ICD-10-CM

## 2015-03-09 DIAGNOSIS — F0280 Dementia in other diseases classified elsewhere without behavioral disturbance: Secondary | ICD-10-CM

## 2015-03-09 LAB — COMPREHENSIVE METABOLIC PANEL
ALBUMIN: 3.9 g/dL (ref 3.5–5.2)
ALK PHOS: 53 U/L (ref 39–117)
ALT: 13 U/L (ref 0–53)
AST: 18 U/L (ref 0–37)
BILIRUBIN TOTAL: 0.4 mg/dL (ref 0.2–1.2)
BUN: 9 mg/dL (ref 6–23)
CALCIUM: 9.3 mg/dL (ref 8.4–10.5)
CO2: 33 meq/L — AB (ref 19–32)
CREATININE: 0.85 mg/dL (ref 0.40–1.50)
Chloride: 95 mEq/L — ABNORMAL LOW (ref 96–112)
GFR: 94.41 mL/min (ref >60.00)
Glucose, Bld: 90 mg/dL (ref 70–99)
Potassium: 4 mEq/L (ref 3.5–5.1)
Sodium: 133 mEq/L — ABNORMAL LOW (ref 135–145)
TOTAL PROTEIN: 6.4 g/dL (ref 6.0–8.3)

## 2015-03-09 LAB — LIPID PANEL
CHOLESTEROL: 235 mg/dL — AB (ref 0–200)
HDL: 64.9 mg/dL (ref >39.00)
LDL Cholesterol: 152 mg/dL — ABNORMAL HIGH (ref 0–99)
NonHDL: 170.35
TRIGLYCERIDES: 90 mg/dL (ref 0.0–149.0)
Total CHOL/HDL Ratio: 4
VLDL: 18 mg/dL (ref 0.0–40.0)

## 2015-03-09 MED ORDER — RAMIPRIL 5 MG PO CAPS: 5.0000 mg | ORAL_CAPSULE | Freq: Every day | ORAL | Status: DC

## 2015-03-09 MED ORDER — HYDROCHLOROTHIAZIDE 25 MG PO TABS: 25.0000 mg | ORAL_TABLET | Freq: Every day | ORAL | Status: AC

## 2015-03-09 NOTE — Assessment & Plan Note (Signed)
No changes made to rxs today. Labs today.

## 2015-03-09 NOTE — Assessment & Plan Note (Signed)
Steady decline but seems to be stable for now. Has follow up with Dr. Malvin JohnsPotter next month. No changes made to rxs today.

## 2015-03-09 NOTE — Assessment & Plan Note (Signed)
Well controlled on current rxs. No changes made today. 

## 2015-03-09 NOTE — Progress Notes (Signed)
Subjective:    Patient ID: Eugene Mcguire, male    DOB: Aug 01, 1945, 69 y.o.   MRN: [id number]  HPI  Very pleasant 69 yo male here with his wife for 6 month follow up.     He still attends the [facility] 4 days per week at [facility].  Really enjoys this--especially deserts they serve there.  Alzheimer's disease- visual spatial variant.  Was followed a couple of times by Dr. Shannan Harper at [facility] but has since transferred care to Dr. Malvin Johns at [facility]. Seeing him for follow up next month.   No recent changes to rx. He attends the [facility] 4 days per week at [facility].    n Namenda 10 mg twice daily and Aricept 10 mg daily.  Having persistent diarrhea- ongoing for months, intermittent with bouts of constipation/firm stools as well. Stool cx, c diff, celiac negative in 11/2014. Still taking a probiotic. Advised to keep a food journal to see if that would help pin point cause- no obvious triggers.  Wife thinks symptoms are a little better.  HTN- on Ramipril 5 mg daily and HCTZ 25 mg daily.   Lab Results  Component Value Date   CREATININE 0.86 05/07/2014   HLD-  On Lipitor 10 mg daily. Lab Results  Component Value Date   CHOL 205* 04/21/2014   HDL 61.70 04/21/2014   LDLCALC 106* 04/21/2014   TRIG 187.0* 04/21/2014   CHOLHDL 3 04/21/2014     Patient Active Problem List   Diagnosis Date Noted  . Diarrhea 10/20/2014  . Urinary frequency 08/11/2014  . Constipation 04/21/2014  . OSA on CPAP 06/19/2013  . Nocturia 05/22/2013  . HTN (hypertension) 12/03/2012  . Hyperlipidemia   . Alzheimer disease   . Seizure disorder [facility])    Past Medical History  Diagnosis Date  . Hyperlipidemia   . Hypertension   . Alzheimer's dementia 2003    visualspacial difficulties  . Colon polyps   . Seizure disorder [facility])    Past Surgical History  Procedure Laterality Date  . Cholecystectomy     Social History  Substance Use  Topics  . Smoking status: Former Games developer  . Smokeless tobacco: Never Used     Comment: smoked in college  . Alcohol Use: Yes   Family History  Problem Relation Age of Onset  . Alcohol abuse Father   . Heart disease Father    Allergies  Allergen Reactions  . Rimantadine Nausea And Vomiting    Hallucinations   Current Outpatient Prescriptions on File Prior to Visit  Medication Sig Dispense Refill  . aspirin EC 81 MG tablet Take 81 mg by mouth daily.    Marland Kitchen donepezil (ARICEPT) 10 MG tablet TAKE ONE TABLET BY MOUTH AT BEDTIME 90 tablet 0  . finasteride (PROSCAR) 5 MG tablet Take 5 mg by mouth daily.    Marland Kitchen lamoTRIgine (LAMICTAL) 100 MG tablet Take 100 mg by mouth 2 (two) times daily.    . memantine (NAMENDA) 10 MG tablet TAKE ONE TABLET BY MOUTH TWICE DAILY 180 tablet 0  . Multiple Vitamin (MULTIVITAMIN) tablet Take 1 tablet by mouth daily.    . Omega-3 Fatty Acids (FISH OIL) 1200 MG CAPS Take 2 capsules by mouth daily    . sertraline (ZOLOFT) 25 MG tablet TAKE ONE TABLET BY MOUTH AT BEDTIME 90 tablet 0  . tamsulosin (FLOMAX) 0.4 MG CAPS capsule Take 1 capsule (0.4 mg total) by mouth daily. 30  capsule 3   No current facility-administered medications on file prior to visit.   The PMH, PSH, Social History, Family History, Medications, and allergies [facility], and have been updated if relevant.     Review of Systems  Constitutional: Negative.   HENT: Negative.   Respiratory: Negative.   Gastrointestinal: Positive for diarrhea and constipation. Negative for nausea, vomiting, abdominal pain, blood in stool, abdominal distention, anal bleeding and rectal pain.  Endocrine: Negative.   Genitourinary: Negative.   Musculoskeletal: Negative.   Skin: Negative.   Neurological: Negative.   Hematological: Negative.   Psychiatric/Behavioral: Negative.   All other systems reviewed and are negative.   Wt Readings from Last 3 Encounters:  03/09/15 170 lb (77.111 kg)  12/31/14  173 lb 8 oz (78.699 kg)  12/25/14 174 lb (78.926 kg)    Objective:   Physical Exam BP 122/68 mmHg  Pulse 62  Temp(Src) 98.2 F (36.8 C) (Oral)  Wt 170 lb (77.111 kg)  SpO2 98% Wt Readings from Last 3 Encounters:  03/09/15 170 lb (77.111 kg)  12/31/14 173 lb 8 oz (78.699 kg)  12/25/14 174 lb (78.926 kg)    General:  Pleasant male, squinting, does respond to questions, makes jokes- seems joyful today HEENT:  Normocephalic Resp:  CTA bilaterally, normal respiratory effort CVS:  RRR Abd:   Soft, NT, pos BS Extremities:  no edema  Skin:  No rashes Neuro:  Alert, oriented        Assessment & Plan:

## 2015-03-09 NOTE — Progress Notes (Signed)
Pre visit review using our clinic review tool, if applicable. No additional management support is needed unless otherwise documented below in the visit note. 

## 2015-03-09 NOTE — Assessment & Plan Note (Signed)
With intermittent constipation. Likely multifactorial- ?IBS, organic causes from brain pathology, rxs. Continue probiotic, prn colace.

## 2015-03-10 MED ORDER — ATORVASTATIN CALCIUM 10 MG PO TABS: 10.0000 mg | ORAL_TABLET | Freq: Every day | ORAL | Status: DC

## 2015-03-10 NOTE — Addendum Note (Signed)
Addended by: Desmond DikeKNIGHT, Arturo Freundlich H on: 03/10/2015 03:59 PM   Modules accepted: Orders

## 2015-04-22 ENCOUNTER — Encounter: Payer: Self-pay | Admitting: Family Medicine

## 2015-04-22 ENCOUNTER — Ambulatory Visit (INDEPENDENT_AMBULATORY_CARE_PROVIDER_SITE_OTHER): Payer: Medicare Other | Admitting: Family Medicine

## 2015-04-22 ENCOUNTER — Telehealth: Payer: Self-pay | Admitting: Family Medicine

## 2015-04-22 VITALS — BP 114/60 | HR 83 | Temp 97.6°F | Wt 165.0 lb

## 2015-04-22 DIAGNOSIS — R1033 Periumbilical pain: Secondary | ICD-10-CM

## 2015-04-22 DIAGNOSIS — R109 Unspecified abdominal pain: Secondary | ICD-10-CM | POA: Insufficient documentation

## 2015-04-22 DIAGNOSIS — K5900 Constipation, unspecified: Secondary | ICD-10-CM

## 2015-04-22 DIAGNOSIS — R350 Frequency of micturition: Secondary | ICD-10-CM

## 2015-04-22 LAB — POC URINALSYSI DIPSTICK (AUTOMATED)
BILIRUBIN UA: NEGATIVE
KETONES UA: NEGATIVE
Leukocytes, UA: NEGATIVE
Nitrite, UA: NEGATIVE
PH UA: 6.5
PROTEIN UA: NEGATIVE
RBC UA: NEGATIVE
SPEC GRAV UA: 1.02
Urobilinogen, UA: 0.2

## 2015-04-22 LAB — BASIC METABOLIC PANEL
BUN: 7 mg/dL (ref 6–23)
CALCIUM: 9.5 mg/dL (ref 8.4–10.5)
CO2: 34 mEq/L — ABNORMAL HIGH (ref 19–32)
Chloride: 96 mEq/L (ref 96–112)
Creatinine, Ser: 0.83 mg/dL (ref 0.40–1.50)
GFR: 97 mL/min (ref >60.00)
Glucose, Bld: 109 mg/dL — ABNORMAL HIGH (ref 70–99)
POTASSIUM: 4.1 meq/L (ref 3.5–5.1)
SODIUM: 135 meq/L (ref 135–145)

## 2015-04-22 NOTE — Telephone Encounter (Signed)
Pt has appt 04/22/15 at 12 noon with Dr Dayton MartesAron.

## 2015-04-22 NOTE — Progress Notes (Signed)
Pre visit review using our clinic review tool, if applicable. No additional management support is needed unless otherwise documented below in the visit note. 

## 2015-04-22 NOTE — Assessment & Plan Note (Signed)
New- with acutely worsening constipation and belching. CT of abdomen and pelvis- rule out partial SBO and other pathology. UA due to urinary frequency. The patient and his wife indicate understanding of these issues and agrees with the plan.

## 2015-04-22 NOTE — Progress Notes (Signed)
Subjective:    Patient ID: Eugene Mcguire, male    DOB: October 04, 1945, 70 y.o.   MRN: [id number]  HPI  Very pleasant 70 yo male with h/o progressive AD with visual spatial deficits, here with his wife for abdominal pain.    Abdominal pain- h/o chronic constipation.  Has been taking miralax and colace which have helped some but his wife feels over past week or so, his bowels have changed- more ribbon like and [name] is now belching more and complaining of periumbilical abdomina pain.  No vomiting.  No fevers.  No blood in his stool.  He has been urinating a little more frequently.   Patient Active Problem List   Diagnosis Date Noted  . Abdominal pain 04/22/2015  . Diarrhea 10/20/2014  . Urinary frequency 08/11/2014  . Constipation 04/21/2014  . OSA on CPAP 06/19/2013  . Nocturia 05/22/2013  . HTN (hypertension) 12/03/2012  . Hyperlipidemia   . Alzheimer disease   . Seizure disorder [facility])    Past Medical History  Diagnosis Date  . Hyperlipidemia   . Hypertension   . Alzheimer's dementia 2003    visualspacial difficulties  . Colon polyps   . Seizure disorder [facility])    Past Surgical History  Procedure Laterality Date  . Cholecystectomy     Social History  Substance Use Topics  . Smoking status: Former Games developer  . Smokeless tobacco: Never Used     Comment: smoked in college  . Alcohol Use: Yes   Family History  Problem Relation Age of Onset  . Alcohol abuse Father   . Heart disease Father    Allergies  Allergen Reactions  . Rimantadine Nausea And Vomiting    Hallucinations   Current Outpatient Prescriptions on File Prior to Visit  Medication Sig Dispense Refill  . aspirin EC 81 MG tablet Take 81 mg by mouth daily.    Marland Kitchen atorvastatin (LIPITOR) 10 MG tablet Take 1 tablet (10 mg total) by mouth daily. 90 tablet 1  . donepezil (ARICEPT) 10 MG tablet TAKE ONE TABLET BY MOUTH AT BEDTIME 90 tablet 0  . finasteride (PROSCAR) 5 MG tablet Take 5 mg by mouth  daily.    . hydrochlorothiazide (HYDRODIURIL) 25 MG tablet Take 1 tablet (25 mg total) by mouth daily. 90 tablet 3  . lamoTRIgine (LAMICTAL) 100 MG tablet Take 100 mg by mouth 2 (two) times daily.    . memantine (NAMENDA) 10 MG tablet TAKE ONE TABLET BY MOUTH TWICE DAILY 180 tablet 0  . Multiple Vitamin (MULTIVITAMIN) tablet Take 1 tablet by mouth daily.    . Omega-3 Fatty Acids (FISH OIL) 1200 MG CAPS Take 2 capsules by mouth daily    . ramipril (ALTACE) 5 MG capsule Take 1 capsule (5 mg total) by mouth daily. 90 capsule 1  . sertraline (ZOLOFT) 25 MG tablet TAKE ONE TABLET BY MOUTH AT BEDTIME 90 tablet 0  . tamsulosin (FLOMAX) 0.4 MG CAPS capsule Take 1 capsule (0.4 mg total) by mouth daily. 30 capsule 3   No current facility-administered medications on file prior to visit.   The PMH, PSH, Social History, Family History, Medications, and allergies have been reviewed in [facility], and have been updated if relevant.     Review of Systems  Constitutional: Negative.   HENT: Negative.   Respiratory: Negative.   Gastrointestinal: Positive for abdominal pain and constipation. Negative for nausea, vomiting, diarrhea, blood in stool, abdominal distention, anal bleeding and rectal pain.  Endocrine: Negative.   Genitourinary:  Negative.   Musculoskeletal: Negative.   Skin: Negative.   Neurological: Negative.   Hematological: Negative.   Psychiatric/Behavioral: Negative.   All other systems reviewed and are negative.   Wt Readings from Last 3 Encounters:  04/22/15 165 lb (74.844 kg)  03/09/15 170 lb (77.111 kg)  12/31/14 173 lb 8 oz (78.699 kg)    Objective:   Physical Exam BP 114/60 mmHg  Pulse 83  Temp(Src) 97.6 F (36.4 C) (Oral)  Wt 165 lb (74.844 kg)  SpO2 97% Wt Readings from Last 3 Encounters:  04/22/15 165 lb (74.844 kg)  03/09/15 170 lb (77.111 kg)  12/31/14 173 lb 8 oz (78.699 kg)    General:  Pleasant male, squinting, does respond to questions, makes jokes- seems joyful  today HEENT:  Normocephalic Resp:  CTA bilaterally, normal respiratory effort CVS:  RRR Abd:   Soft, mildly TTP over periumbilical area, BS normal Extremities:  no edema  Skin:  No rashes Neuro:  Alert, oriented        Assessment & Plan:

## 2015-04-22 NOTE — Progress Notes (Deleted)
   Subjective:   Patient ID: Eugene Mcguire, male    DOB: 1945-09-07, 70 y.o.   MRN: [id number]  Eugene Mcguire is a pleasant 70 y.o. year old male who presents to clinic today with Abdominal Pain and Constipation  on 04/22/2015  HPI: ***  Review of Systems     Objective:    BP 114/60 mmHg  Pulse 83  Temp(Src) 97.6 F (36.4 C) (Oral)  Wt 165 lb (74.844 kg)  SpO2 97%   Physical Exam        Assessment & Plan:   No diagnosis found. No Follow-up on file.

## 2015-04-22 NOTE — Addendum Note (Signed)
Addended by: Alvina ChouWALSH, TERRI J on: 04/22/2015 12:42 PM   Modules accepted: Orders

## 2015-04-22 NOTE — Addendum Note (Signed)
Addended by: Desmond DikeKNIGHT, Milta Croson H on: 04/22/2015 12:58 PM   Modules accepted: Orders

## 2015-04-22 NOTE — Addendum Note (Signed)
Addended by: Dianne DunARON, TALIA M on: 04/22/2015 01:33 PM   Modules accepted: Orders

## 2015-04-22 NOTE — Telephone Encounter (Signed)
Patient Name: Eugene DarlingWILLIAM Mcguire  DOB: [birth year]    Initial Comment Caller states husband has severe abdominal pain, constipation, increased belching   Nurse Assessment  Nurse: Vickey SagesAtkins, RN, Jacquilin Date/Time (Eastern Time): 04/22/2015 8:02:48 AM  Confirm and document reason for call. If symptomatic, describe symptoms. ---Caller states husband has lower abdominal pain (comes and goes), constipation (is able to have pass stool but is not going a normal amount- ongoing issue) , increased belching  Has the patient traveled out of the country within the last 30 days? ---No  Does the patient have any new or worsening symptoms? ---Yes  Will a triage be completed? ---Yes  Related visit to physician within the last 2 weeks? ---No  Does the PT have any chronic conditions? (i.e. diabetes, asthma, etc.) ---Yes  List chronic conditions. ---Alzheimer  Is this a behavioral health or substance abuse call? ---No     Guidelines    Guideline Title Affirmed Question Affirmed Notes  Abdominal Pain - Male [1] MILD pain (e.g., does not interfere with normal activities) AND [2] pain comes and goes (cramps) [3] present > 48 hours    Final Disposition User   See Physician within 24 Hours Atkins, RN, Jacquilin    Comments  Appointment made for today at 12 with PCP   Referrals  REFERRED TO PCP OFFICE

## 2015-04-22 NOTE — Patient Instructions (Signed)
Great to see you. Please stop by to see Marion on your way out.   

## 2015-04-23 ENCOUNTER — Inpatient Hospital Stay: Admission: RE | Admit: 2015-04-23 | Payer: Self-pay | Source: Ambulatory Visit

## 2015-04-23 ENCOUNTER — Ambulatory Visit (INDEPENDENT_AMBULATORY_CARE_PROVIDER_SITE_OTHER)
Admission: RE | Admit: 2015-04-23 | Discharge: 2015-04-23 | Disposition: A | Discharge status: Home / Self Care | Payer: Medicare Other | Source: Ambulatory Visit | Attending: Family Medicine | Admitting: Family Medicine

## 2015-04-23 DIAGNOSIS — K5900 Constipation, unspecified: Secondary | ICD-10-CM

## 2015-04-23 DIAGNOSIS — R1033 Periumbilical pain: Secondary | ICD-10-CM

## 2015-04-23 MED ORDER — IOHEXOL 300 MG/ML  SOLN
100.0000 mL | Freq: Once | INTRAMUSCULAR | Status: AC | PRN
  Administered 2015-04-23: 100 mL via INTRAVENOUS

## 2015-05-11 ENCOUNTER — Other Ambulatory Visit: Payer: Self-pay | Admitting: Family Medicine

## 2015-05-11 DIAGNOSIS — F329 Major depressive disorder, single episode, unspecified: Principal | ICD-10-CM

## 2015-05-11 DIAGNOSIS — F0393 Unspecified dementia, unspecified severity, with mood disturbance: Secondary | ICD-10-CM | POA: Insufficient documentation

## 2015-05-11 DIAGNOSIS — F0280 Dementia in other diseases classified elsewhere without behavioral disturbance: Secondary | ICD-10-CM | POA: Insufficient documentation

## 2015-05-11 NOTE — Telephone Encounter (Signed)
i am not sure as to why pt is taking med. Pt has only been seen for acute recently.

## 2015-05-11 NOTE — Telephone Encounter (Signed)
For alzheimer's related anxiety/depression. Added to diagnosis list.  Ok to refill.

## 2015-06-10 ENCOUNTER — Ambulatory Visit: Payer: Self-pay | Admitting: Family Medicine

## 2015-06-15 ENCOUNTER — Encounter: Payer: Self-pay | Admitting: Family Medicine

## 2015-06-15 ENCOUNTER — Ambulatory Visit: Payer: Self-pay | Admitting: Family Medicine

## 2015-06-15 ENCOUNTER — Other Ambulatory Visit: Payer: Self-pay

## 2015-06-15 ENCOUNTER — Ambulatory Visit (INDEPENDENT_AMBULATORY_CARE_PROVIDER_SITE_OTHER): Payer: Medicare Other | Admitting: Family Medicine

## 2015-06-15 VITALS — BP 106/52 | HR 60 | Temp 97.6°F | Wt 162.8 lb

## 2015-06-15 DIAGNOSIS — I10 Essential (primary) hypertension: Secondary | ICD-10-CM

## 2015-06-15 DIAGNOSIS — F0280 Dementia in other diseases classified elsewhere without behavioral disturbance: Secondary | ICD-10-CM

## 2015-06-15 DIAGNOSIS — R350 Frequency of micturition: Secondary | ICD-10-CM

## 2015-06-15 DIAGNOSIS — R3911 Hesitancy of micturition: Secondary | ICD-10-CM

## 2015-06-15 DIAGNOSIS — Z9989 Dependence on other enabling machines and devices: Secondary | ICD-10-CM

## 2015-06-15 DIAGNOSIS — E785 Hyperlipidemia, unspecified: Secondary | ICD-10-CM

## 2015-06-15 DIAGNOSIS — G40909 Epilepsy, unspecified, not intractable, without status epilepticus: Secondary | ICD-10-CM | POA: Diagnosis not present

## 2015-06-15 DIAGNOSIS — G4733 Obstructive sleep apnea (adult) (pediatric): Secondary | ICD-10-CM | POA: Diagnosis not present

## 2015-06-15 DIAGNOSIS — F329 Major depressive disorder, single episode, unspecified: Secondary | ICD-10-CM

## 2015-06-15 DIAGNOSIS — J309 Allergic rhinitis, unspecified: Secondary | ICD-10-CM

## 2015-06-15 DIAGNOSIS — F0393 Unspecified dementia, unspecified severity, with mood disturbance: Secondary | ICD-10-CM

## 2015-06-15 DIAGNOSIS — K5900 Constipation, unspecified: Secondary | ICD-10-CM

## 2015-06-15 DIAGNOSIS — G309 Alzheimer's disease, unspecified: Secondary | ICD-10-CM

## 2015-06-15 DIAGNOSIS — N400 Enlarged prostate without lower urinary tract symptoms: Secondary | ICD-10-CM

## 2015-06-15 MED ORDER — FINASTERIDE 5 MG PO TABS
5.0000 mg | ORAL_TABLET | Freq: Every day | ORAL | Status: DC
Start: 2015-06-15 — End: 2015-06-15

## 2015-06-15 NOTE — Telephone Encounter (Signed)
Mrs Eugene Mcguire [facility](DPR signed) said pt was seen earlier today and pts wife noticed todays refills [facility] and Mrs Eugene Mcguire said has never used mail order and does not want to use mail order; Mrs Eugene Mcguire wants mail order request stopped and then send refills for finasteride and tamsulosin to [facility] [address]. Pt's wife request cb when been completed.

## 2015-06-15 NOTE — Progress Notes (Signed)
Pre visit review using our clinic review tool, if applicable. No additional management support is needed unless otherwise documented below in the visit note. 

## 2015-06-15 NOTE — Assessment & Plan Note (Signed)
Normotensive. No changes made to rxs today. 

## 2015-06-15 NOTE — Patient Instructions (Signed)
Good to see you. Please come to see me in 3 months.

## 2015-06-15 NOTE — Assessment & Plan Note (Signed)
Symptoms controlled

## 2015-06-15 NOTE — Assessment & Plan Note (Addendum)
Steady declined.  Has lost some weight but wife states that his appetite seems pretty good. No changes made to rxs today.

## 2015-06-15 NOTE — Assessment & Plan Note (Signed)
Well controlled. No changes made to rxs today. 

## 2015-06-15 NOTE — Progress Notes (Signed)
Subjective:    Patient ID: Eugene BlalockWilliam H Toure, male    DOB: [birth year], 70 y.o.   MRN: [id number]  HPI  Very pleasant 70 yo male here with his wife for 6 month follow up.     He still attends the [facility] 4 days per [facility].  Really enjoys this--especially deserts they serve there.  Alzheimer's disease- visual spatial variant.  Was followed a couple of times by Dr. Shannan HarperStrittmatter at [facility] but has since transferred care to [facility] Malvin [facility]Potter [facility]. Seeing him for follow up next month.   No recent changes to rx. He attends the [facility] per [facility].    On Namenda 10 mg twice daily and Aricept 10 mg daily.   HTN- on Ramipril 5 mg daily and HCTZ 25 mg daily.   Lab Results  Component Value Date   CREATININE 0.83 04/22/2015   HLD-  On Lipitor 10 mg daily. Lab Results  Component Value Date   CHOL 235* 03/09/2015   HDL 64.90 03/09/2015   LDLCALC 152* 03/09/2015   TRIG 90.0 03/09/2015   CHOLHDL 4 03/09/2015   Lab Results  Component Value Date   ALT 13 03/09/2015   AST 18 03/09/2015   ALKPHOS 53 03/09/2015   BILITOT 0.4 03/09/2015     Patient Active Problem List   Diagnosis Date Noted  . 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 Diarrhea 10/20/2014  . Urinary frequency 08/11/2014  . Constipation 04/21/2014  . OSA on CPAP 06/19/2013  . Nocturia 05/22/2013  . HTN (hypertension) 12/03/2012  . Hyperlipidemia   . Alzheimer disease   . Seizure disorder [facility](HCC)    Past Medical History  Diagnosis Date  . Hyperlipidemia   . Hypertension   . Alzheimer's dementia 2003    visualspacial difficulties  . Colon polyps   . Seizure disorder [facility](HCC)    Past Surgical History  Procedure Laterality Date  . Cholecystectomy     Social History  Substance Use Topics  . Smoking status: Former Games developermoker  . Smokeless tobacco: Never Used     Comment: smoked in college  . Alcohol Use: Yes   Family History   Problem Relation Age of Onset  . Alcohol abuse Father   . Heart disease Father    Allergies  Allergen Reactions  . Rimantadine Nausea And Vomiting    Hallucinations   Current Outpatient Prescriptions on File Prior to Visit  Medication Sig Dispense Refill  . aspirin EC 81 MG tablet Take 81 mg by mouth daily.    Marland Kitchen. atorvastatin (LIPITOR) 10 MG tablet Take 1 tablet (10 mg total) by mouth daily. 90 tablet 1  . donepezil (ARICEPT) 10 MG tablet TAKE ONE TABLET BY MOUTH AT BEDTIME 90 tablet 0  . finasteride (PROSCAR) 5 MG tablet Take 5 mg by mouth daily.    . hydrochlorothiazide (HYDRODIURIL) 25 MG tablet Take 1 tablet (25 mg total) by mouth daily. 90 tablet 3  . memantine (NAMENDA) 10 MG tablet TAKE ONE TABLET BY MOUTH TWICE DAILY 180 tablet 0  . Multiple Vitamin (MULTIVITAMIN) tablet Take 1 tablet by mouth daily.    . Omega-3 Fatty Acids (FISH OIL) 1200 MG CAPS Take 2 capsules by mouth daily    . ramipril (ALTACE) 5 MG capsule Take 1 capsule (5 mg total) by mouth daily. 90 capsule 1  . sertraline (ZOLOFT) 25 MG tablet TAKE ONE TABLET BY MOUTH AT  BEDTIME 90 tablet 0  . tamsulosin (FLOMAX) 0.4 MG CAPS capsule Take 1 capsule (0.4 mg total) by mouth daily. 30 capsule 3   No current facility-administered medications on file prior to visit.   The PMH, PSH, Social History, Family History, Medications, and allergies have been reviewed in [facility], and have been updated if relevant.     Review of Systems  Constitutional: Negative.   HENT: Positive for postnasal drip and rhinorrhea.   Respiratory: Negative.   Gastrointestinal: Positive for diarrhea and constipation. Negative for nausea, vomiting, abdominal pain, blood in stool, abdominal distention, anal bleeding and rectal pain.  Endocrine: Negative.   Genitourinary: Negative.   Musculoskeletal: Negative.   Skin: Negative.   Neurological: Negative.   Hematological: Negative.   Psychiatric/Behavioral: Negative.   All other systems reviewed and  are negative.   Wt Readings from Last 3 Encounters:  06/15/15 162 lb 12 oz (73.823 kg)  04/22/15 165 lb (74.844 kg)  03/09/15 170 lb (77.111 kg)    Objective:   Physical Exam  Constitutional: He is oriented to person, place, and time. He appears well-developed and well-nourished. No distress.  HENT:  Head: Normocephalic.  Eyes: Conjunctivae are normal.  Cardiovascular: Normal rate and regular rhythm.   Pulmonary/Chest: Effort normal and breath sounds normal.  Musculoskeletal: Normal range of motion.  Neurological: He is alert and oriented to person, place, and time. No cranial nerve deficit.  Skin: Skin is warm and dry. He is not diaphoretic.  Psychiatric: He has a normal mood and affect. His behavior is normal. Judgment and thought content normal.  Nursing note and vitals reviewed.  BP 106/52 mmHg  Pulse 60  Temp(Src) 97.6 F (36.4 C) (Oral)  Wt 162 lb 12 oz (73.823 kg)  SpO2 98% Wt Readings from Last 3 Encounters:  06/15/15 162 lb 12 oz (73.823 kg)  04/22/15 165 lb (74.844 kg)  03/09/15 170 lb (77.111 kg)            Assessment & Plan:

## 2015-06-15 NOTE — Telephone Encounter (Signed)
Noted.  PLease apologize and refill as requested.

## 2015-06-15 NOTE — Assessment & Plan Note (Signed)
No recurrent seizures. Followed by neuro.

## 2015-06-16 ENCOUNTER — Ambulatory Visit: Payer: Self-pay | Admitting: Family Medicine

## 2015-06-16 MED ORDER — FINASTERIDE 5 MG PO TABS: 5.0000 mg | ORAL_TABLET | Freq: Every day | ORAL | Status: AC

## 2015-06-16 MED ORDER — TAMSULOSIN HCL 0.4 MG PO CAPS: 0.4000 mg | ORAL_CAPSULE | Freq: Every day | ORAL | Status: DC

## 2015-07-06 ENCOUNTER — Other Ambulatory Visit: Payer: Self-pay

## 2015-07-06 DIAGNOSIS — N400 Enlarged prostate without lower urinary tract symptoms: Secondary | ICD-10-CM

## 2015-07-06 DIAGNOSIS — R3911 Hesitancy of micturition: Secondary | ICD-10-CM

## 2015-07-06 DIAGNOSIS — R350 Frequency of micturition: Secondary | ICD-10-CM

## 2015-07-06 MED ORDER — TAMSULOSIN HCL 0.4 MG PO CAPS: 0.4000 mg | ORAL_CAPSULE | Freq: Every day | ORAL | Status: AC

## 2015-07-06 NOTE — Telephone Encounter (Signed)
Mrs Eugene Mcguire request tamsulosin 90 day rx to [facility] [address]. Advised done and noted on electronic submission to cancel # 30 x 3 refills sent on 06/16/15.

## 2015-08-09 ENCOUNTER — Other Ambulatory Visit: Payer: Self-pay | Admitting: Family Medicine

## 2015-08-09 NOTE — Telephone Encounter (Signed)
PT REQUESTING A REFILL. PT'S LAST VISIT 06/15/15, LAST REFILL 05/11/15. OK TO REFILL?

## 2015-08-30 ENCOUNTER — Other Ambulatory Visit: Payer: Self-pay | Admitting: Family Medicine

## 2015-09-26 ENCOUNTER — Encounter: Payer: Self-pay | Admitting: Family Medicine

## 2015-09-28 ENCOUNTER — Telehealth: Payer: Self-pay | Admitting: Family Medicine

## 2015-09-28 ENCOUNTER — Encounter: Payer: Self-pay | Admitting: Family Medicine

## 2015-09-28 ENCOUNTER — Ambulatory Visit (INDEPENDENT_AMBULATORY_CARE_PROVIDER_SITE_OTHER): Payer: Medicare Other | Admitting: Family Medicine

## 2015-09-28 VITALS — BP 152/78 | HR 66 | Temp 97.7°F | Wt 166.0 lb

## 2015-09-28 DIAGNOSIS — G309 Alzheimer's disease, unspecified: Secondary | ICD-10-CM | POA: Diagnosis not present

## 2015-09-28 DIAGNOSIS — Z7189 Other specified counseling: Secondary | ICD-10-CM

## 2015-09-28 DIAGNOSIS — G40909 Epilepsy, unspecified, not intractable, without status epilepticus: Secondary | ICD-10-CM

## 2015-09-28 DIAGNOSIS — R351 Nocturia: Secondary | ICD-10-CM | POA: Diagnosis not present

## 2015-09-28 DIAGNOSIS — F329 Major depressive disorder, single episode, unspecified: Secondary | ICD-10-CM | POA: Diagnosis not present

## 2015-09-28 DIAGNOSIS — F0393 Unspecified dementia, unspecified severity, with mood disturbance: Secondary | ICD-10-CM

## 2015-09-28 DIAGNOSIS — F0280 Dementia in other diseases classified elsewhere without behavioral disturbance: Secondary | ICD-10-CM

## 2015-09-28 LAB — COMPREHENSIVE METABOLIC PANEL
ALT: 17 U/L (ref 0–53)
AST: 20 U/L (ref 0–37)
Albumin: 4.4 g/dL (ref 3.5–5.2)
Alkaline Phosphatase: 64 U/L (ref 39–117)
BUN: 11 mg/dL (ref 6–23)
CALCIUM: 9.8 mg/dL (ref 8.4–10.5)
CHLORIDE: 96 meq/L (ref 96–112)
CO2: 35 meq/L — AB (ref 19–32)
CREATININE: 0.8 mg/dL (ref 0.40–1.50)
GFR: 101.09 mL/min (ref >60.00)
Glucose, Bld: 105 mg/dL — ABNORMAL HIGH (ref 70–99)
POTASSIUM: 3.8 meq/L (ref 3.5–5.1)
Sodium: 135 mEq/L (ref 135–145)
Total Bilirubin: 0.4 mg/dL (ref 0.2–1.2)
Total Protein: 7.1 g/dL (ref 6.0–8.3)

## 2015-09-28 LAB — POC URINALSYSI DIPSTICK (AUTOMATED)
Bilirubin, UA: NEGATIVE
Blood, UA: NEGATIVE
GLUCOSE UA: NEGATIVE
Ketones, UA: NEGATIVE
LEUKOCYTES UA: NEGATIVE
NITRITE UA: NEGATIVE
Protein, UA: NEGATIVE
Spec Grav, UA: 1.02
UROBILINOGEN UA: 0.2
pH, UA: 6

## 2015-09-28 NOTE — Assessment & Plan Note (Signed)
>  40 minutes spent in face to face time with patient, >50% spent in counselling or coordination of care. DNR form filled out and returned to patient's wife.

## 2015-09-28 NOTE — Assessment & Plan Note (Signed)
Followed by Dr. Malvin JohnsPotter. Will check lamictal level.

## 2015-09-28 NOTE — Addendum Note (Signed)
Addended by: Dianne DunARON, TALIA M on: 09/28/2015 11:25 AM   Modules accepted: Orders

## 2015-09-28 NOTE — Progress Notes (Signed)
Pre visit review using our clinic review tool, if applicable. No additional management support is needed unless otherwise documented below in the visit note. 

## 2015-09-28 NOTE — Progress Notes (Signed)
Subjective:    Patient ID: Eugene Mcguire, male    DOB: 1945-09-20, 70 y.o.   MRN: [id number]  HPI  Very pleasant 70 yo male here with his wife and daughter Irving Burton for 3 month follow up.     Alzheimer's disease- visual spatial variant.  Was followed a couple of times by Dr. Shannan Harper at [facility] but has since transferred care to Dr. Malvin Johns at [facility].   Unfortunately, over the past several weeks, his symptoms are deteriorated quite rapidly.  He is having more difficulty walking, feeding himself and increased episodes of agitation.      On Namenda 10 mg twice daily and Aricept 10 mg daily.  They do have a skilled nursing bed for him at memory care.  Wife brings in [facility] for me to complete but does not want me to tell him about it yet.   HTN- on Ramipril 5 mg daily and HCTZ 25 mg daily.   Lab Results  Component Value Date   CREATININE 0.83 04/22/2015   HLD-  On Lipitor 10 mg daily. Lab Results  Component Value Date   CHOL 235* 03/09/2015   HDL 64.90 03/09/2015   LDLCALC 152* 03/09/2015   TRIG 90.0 03/09/2015   CHOLHDL 4 03/09/2015   Lab Results  Component Value Date   ALT 13 03/09/2015   AST 18 03/09/2015   ALKPHOS 53 03/09/2015   BILITOT 0.4 03/09/2015     Patient Active Problem List   Diagnosis Date Noted  . Allergic rhinitis 06/15/2015  . 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 Diarrhea 10/20/2014  . Urinary frequency 08/11/2014  . Constipation 04/21/2014  . OSA on CPAP 06/19/2013  . Nocturia 05/22/2013  . HTN (hypertension) 12/03/2012  . Hyperlipidemia   . Alzheimer disease   . Seizure disorder [facility])    Past Medical History  Diagnosis Date  . Hyperlipidemia   . Hypertension   . Alzheimer's dementia 2003    visualspacial difficulties  . Colon polyps   . Seizure disorder [facility])    Past Surgical History  Procedure Laterality Date  . Cholecystectomy     Social History  Substance Use Topics  . Smoking status:  Former Games developer  . Smokeless tobacco: Never Used     Comment: smoked in college  . Alcohol Use: Yes   Family History  Problem Relation Age of Onset  . Alcohol abuse Father   . Heart disease Father    Allergies  Allergen Reactions  . Rimantadine Nausea And Vomiting    Hallucinations   Current Outpatient Prescriptions on File Prior to Visit  Medication Sig Dispense Refill  . aspirin EC 81 MG tablet Take 81 mg by mouth daily.    Marland Kitchen atorvastatin (LIPITOR) 10 MG tablet TAKE ONE TABLET BY MOUTH ONCE DAILY 90 tablet 0  . donepezil (ARICEPT) 10 MG tablet TAKE ONE TABLET BY MOUTH AT BEDTIME 90 tablet 0  . finasteride (PROSCAR) 5 MG tablet Take 1 tablet (5 mg total) by mouth daily. 90 tablet 3  . hydrochlorothiazide (HYDRODIURIL) 25 MG tablet Take 1 tablet (25 mg total) by mouth daily. 90 tablet 3  . lamoTRIgine (LAMICTAL) 150 MG tablet Take 150 mg by mouth 2 (two) times daily.    . memantine (NAMENDA) 10 MG tablet TAKE ONE TABLET BY MOUTH TWICE DAILY 180 tablet 0  . Multiple Vitamin (MULTIVITAMIN) tablet Take 1 tablet by mouth daily.    . Omega-3 Fatty Acids (FISH OIL) 1200 MG CAPS Take  2 capsules by mouth daily    . ramipril (ALTACE) 5 MG capsule TAKE ONE CAPSULE BY MOUTH ONCE DAILY 90 capsule 0  . sertraline (ZOLOFT) 25 MG tablet TAKE ONE TABLET BY MOUTH AT BEDTIME 90 tablet 0  . tamsulosin (FLOMAX) 0.4 MG CAPS capsule Take 1 capsule (0.4 mg total) by mouth daily. 90 capsule 0   No current facility-administered medications on file prior to visit.   The PMH, PSH, Social History, Family History, Medications, and allergies have been [facility], and have been updated if relevant.     Review of Systems  Constitutional: Negative.   HENT: Negative for postnasal drip and rhinorrhea.   Respiratory: Negative.   Gastrointestinal: Positive for diarrhea and constipation. Negative for nausea, vomiting, abdominal pain, blood in stool, abdominal distention, anal bleeding and rectal pain.   Endocrine: Negative.   Genitourinary: Negative.   Musculoskeletal: Negative.   Skin: Negative.   Neurological: Negative.   Hematological: Negative.   Psychiatric/Behavioral: Positive for confusion and agitation.  All other systems reviewed and are negative.   Wt Readings from Last 3 Encounters:  09/28/15 166 lb (75.297 kg)  06/15/15 162 lb 12 oz (73.823 kg)  04/22/15 165 lb (74.844 kg)    Objective:   Physical Exam  Constitutional: He is oriented to person, place, and time. He appears well-developed and well-nourished. No distress.  In a wheelchair today which is a change   HENT:  Head: Normocephalic.  Eyes: Conjunctivae are normal.  Cardiovascular: Normal rate and regular rhythm.   Pulmonary/Chest: Effort normal and breath sounds normal.  Musculoskeletal: Normal range of motion.  Neurological: He is alert and oriented to person, place, and time. No cranial nerve deficit.  Skin: Skin is warm and dry. He is not diaphoretic.  Psychiatric: He has a normal mood and affect. His behavior is normal. Judgment and thought content normal.  Nursing note and vitals reviewed.  BP 152/78 mmHg  Pulse 66  Temp(Src) 97.7 F (36.5 C) (Oral)  Wt 166 lb (75.297 kg)  SpO2 96% Wt Readings from Last 3 Encounters:  09/28/15 166 lb (75.297 kg)  06/15/15 162 lb 12 oz (73.823 kg)  04/22/15 165 lb (74.844 kg)            Assessment & Plan:

## 2015-09-28 NOTE — Telephone Encounter (Signed)
Patient's spouse would like the results of the labs he took today.  Please call.

## 2015-09-28 NOTE — Addendum Note (Signed)
Addended by: Alvina ChouWALSH, TERRI J on: 09/28/2015 12:07 PM   Modules accepted: Orders

## 2015-09-28 NOTE — Assessment & Plan Note (Signed)
Now deteriorating quite rapidly. Wife has done an incredible job taking care of Bill at home but I do agree that now SNF bed is appropriate. FL2 form given to me to fill out. Will also check urine micro today to rule out a UTI as possible cause of recently rapid decline.

## 2015-09-28 NOTE — Telephone Encounter (Signed)
PLEASE NOTE: All timestamps contained within this report are represented as [place] Standard Time. CONFIDENTIALTY NOTICE: This fax transmission is intended only for the addressee. It contains information that is legally privileged, confidential or otherwise protected from use or disclosure. If you are not the intended recipient, you are strictly prohibited from reviewing, disclosing, copying using or disseminating any of this information or taking any action in reliance on or regarding this information. If you have received this fax in error, please notify us immediately by telephone so that we can arrange for its return to us. Phone: [phone], Toll-Free: [phone], Fax: [phone] Page: 1 of 2 Call Id: [id number] [facility] - Client TELEPHONE ADVICE RECORD [facility] Patient Name: Eugene Mcguire ResidesWILLIAM MCCORMIC K Gender: Male DOB: 11-15-1945 Age: [age] Y 8 M 4 D Return Phone Number: [phone] (Primary), [phone] (Secondary) Address: City/State/Zip: [place] [id number] Client [facility] - Client Client Site [facility] - Day Physician Ruthe MannanAron, Talia - MD Contact Type Call Who Is Calling Patient / Member / Family / Caregiver Call Type Triage / Clinical Caller Name Purcell MoutonJoan Grogan Relationship To Patient Spouse Return Phone Number [phone] (Primary) Chief Complaint Muscle Jerks, Tics And Shudders Reason for Call Symptomatic / Request for Health Information Initial Comment Caller states husband in later stages of Alzheimer's and saw dr today. Put out don't resuscitate order. Having involuntary jerking/startled reflex, not continuous. Has pt in bed and wants to keep him in bed until it passes. Doesn't want to go to ER. States nothing hurts him and he seems to be falling asleep now Appointment Disposition EMR Appointment Not Necessary Info pasted into Epic Yes Translation No Nurse  Assessment Nurse: Odis LusterBowers, RN, Bjorn Loserhonda Date/Time (Eastern Time): 09/28/2015 4:18:16 PM Confirm and document reason for call. If symptomatic, describe symptoms. You must click the next button to save text entered. ---Caller states husband in later stages of Alzheimer's and saw dr today. Put out don't resuscitate order. Having involuntary jerking/startled reflex, not continuous. Has pt in bed and wants to keep him in bed until it passes. Doesn't want to go to ER. States nothing hurts him and he seems to be falling asleep now. Reports that the patient is using the urinal. Wanting to speak to MD regarding patient's Lamictal level that was drawn today. (due to the jerking spells). This was done today. Reports labs were drawn around noon today. Has the patient traveled out of the country within the last 30 days? ---Not Applicable Does the patient have any new or worsening symptoms? ---No Please document clinical information provided and list any resource used. ---Nurse spoke with the MD office who reports that some of the labwork is back and will ask that the nurse/MD review and give the caller a call when possible. Caller informed. Denies need for triage at this time, states that patient is currently resting. Advised caller to call back for new/worsening symptoms. Guidelines Guideline Title Affirmed Question Affirmed Notes Nurse Date/Time (Eastern Time) PLEASE NOTE: All timestamps contained within this report are represented as [place] Standard Time. CONFIDENTIALTY NOTICE: This fax transmission is intended only for the addressee. It contains information that is legally privileged, confidential or otherwise protected from use or disclosure. If you are not the intended recipient, you are strictly prohibited from reviewing, disclosing, copying using or disseminating any of this information or taking any action in reliance on or regarding this information. If you have received this fax in error,  please notify us immediately by telephone so that we can arrange for its return to [place]. Phone: [phone], Toll-Free: [phone], Fax: [phone] Page: 2 of 2 Call Id: [id number] Disp. Time Lamount Cohen Time) Disposition Final User 09/28/2015 4:33:50 PM Clinical Call Yes Odis Luster, RN, Bjorn Loser

## 2015-09-28 NOTE — Telephone Encounter (Signed)
TELEPHONE ADVICE RECORD [facility]  Patient Name: Eugene Mcguire  DOB: [birth year]    Initial Comment Caller states husband in later stages of Alzheimer's and saw dr today. Put out don't resuscitate order. Having involuntary jerking/startled reflex, not continuous. Has pt in bed and wants to keep him in bed until it passes. Doesn't want to go to ER. States nothing hurts him and he seems to be falling asleep now   Nurse Assessment  Nurse: Odis LusterBowers, RN, Bjorn Loserhonda Date/Time (Eastern Time): 09/28/2015 4:18:16 PM  Confirm and document reason for call. If symptomatic, describe symptoms. You must click the next button to save text entered. ---Caller states husband in later stages of Alzheimer's and saw dr today. Put out don't resuscitate order. Having involuntary jerking/startled reflex, not continuous. Has pt in bed and wants to keep him in bed until it passes. Doesn't want to go to ER. States nothing hurts him and he seems to be falling asleep now. Reports that the patient is using the urinal. Wanting to speak to MD regarding patient's Lamictal level that was drawn today. (due to the jerking spells). This was done today. Reports labs were drawn around noon today.  Has the patient traveled out of the country within the last 30 days? ---Not Applicable  Does the patient have any new or worsening symptoms? ---No  Please document clinical information provided and list any resource used. ---Nurse spoke with the MD office who reports that some of the labwork is back and will ask that the nurse/MD review and give the caller a call when possible. Caller informed. Denies need for triage at this time, states that patient is currently resting. Advised caller to call back for new/worsening symptoms.     Guidelines    Guideline Title Affirmed Question Affirmed Notes       Final Disposition User   Clinical Call Odis LusterBowers, RN, Bjorn Loserhonda

## 2015-09-28 NOTE — Patient Instructions (Signed)
Great to see you. We will call you with your results from today. 

## 2015-09-28 NOTE — Addendum Note (Signed)
Addended by: Desmond DikeKNIGHT, Sandy Blouch H on: 09/28/2015 12:38 PM   Modules accepted: Orders

## 2015-09-29 NOTE — Telephone Encounter (Signed)
Please call to check on pt. 

## 2015-09-29 NOTE — Telephone Encounter (Signed)
Spoke to pts wife, who states that pts s/s have now subsided. She states he was appearing as though he was startled, but episode only lasted appt 30 mins. She states that this is not abnormal, but normally only occurs in the evenings and on today, is was during the daytime hours. Pt is now up and eating, and back to "his normal."

## 2015-09-29 NOTE — Telephone Encounter (Signed)
Spoke to pts wife and advised her labs have not been resulted. She will be contacted once Dr Dayton MartesAron has viewed them

## 2015-09-29 NOTE — Telephone Encounter (Signed)
Wonderful.  Thanks for the update. 

## 2015-09-30 LAB — LAMOTRIGINE LEVEL: LAMOTRIGINE LVL: 11.2 ug/mL (ref 4.0–18.0)

## 2015-10-14 DIAGNOSIS — G301 Alzheimer's disease with late onset: Secondary | ICD-10-CM | POA: Diagnosis not present

## 2015-10-14 DIAGNOSIS — I10 Essential (primary) hypertension: Secondary | ICD-10-CM | POA: Diagnosis not present

## 2015-10-14 DIAGNOSIS — N400 Enlarged prostate without lower urinary tract symptoms: Secondary | ICD-10-CM | POA: Diagnosis not present

## 2015-10-14 DIAGNOSIS — G40909 Epilepsy, unspecified, not intractable, without status epilepticus: Secondary | ICD-10-CM | POA: Diagnosis not present

## 2015-10-14 DIAGNOSIS — F39 Unspecified mood [affective] disorder: Secondary | ICD-10-CM

## 2015-11-18 DIAGNOSIS — R569 Unspecified convulsions: Secondary | ICD-10-CM | POA: Diagnosis not present

## 2015-11-18 DIAGNOSIS — I10 Essential (primary) hypertension: Secondary | ICD-10-CM | POA: Diagnosis not present

## 2015-11-18 DIAGNOSIS — G301 Alzheimer's disease with late onset: Secondary | ICD-10-CM | POA: Diagnosis not present

## 2015-11-18 DIAGNOSIS — F39 Unspecified mood [affective] disorder: Secondary | ICD-10-CM | POA: Diagnosis not present

## 2015-11-18 DIAGNOSIS — N400 Enlarged prostate without lower urinary tract symptoms: Secondary | ICD-10-CM

## 2015-12-10 DIAGNOSIS — I10 Essential (primary) hypertension: Secondary | ICD-10-CM | POA: Diagnosis not present

## 2015-12-10 DIAGNOSIS — G309 Alzheimer's disease, unspecified: Secondary | ICD-10-CM | POA: Diagnosis not present

## 2015-12-10 DIAGNOSIS — F39 Unspecified mood [affective] disorder: Secondary | ICD-10-CM

## 2015-12-10 DIAGNOSIS — N401 Enlarged prostate with lower urinary tract symptoms: Secondary | ICD-10-CM | POA: Diagnosis not present

## 2015-12-10 DIAGNOSIS — R569 Unspecified convulsions: Secondary | ICD-10-CM | POA: Diagnosis not present

## 2016-01-26 DIAGNOSIS — G309 Alzheimer's disease, unspecified: Secondary | ICD-10-CM | POA: Diagnosis not present

## 2016-01-26 DIAGNOSIS — G40909 Epilepsy, unspecified, not intractable, without status epilepticus: Secondary | ICD-10-CM | POA: Diagnosis not present

## 2016-01-26 DIAGNOSIS — N401 Enlarged prostate with lower urinary tract symptoms: Secondary | ICD-10-CM | POA: Diagnosis not present

## 2016-01-26 DIAGNOSIS — F39 Unspecified mood [affective] disorder: Secondary | ICD-10-CM

## 2016-01-26 DIAGNOSIS — I10 Essential (primary) hypertension: Secondary | ICD-10-CM | POA: Diagnosis not present

## 2016-02-07 ENCOUNTER — Telehealth: Payer: Self-pay

## 2016-02-07 NOTE — Telephone Encounter (Signed)
PLEASE NOTE: All timestamps contained within this report are represented as [place] Standard Time. CONFIDENTIALTY NOTICE: This fax transmission is intended only for the addressee. It contains information that is legally privileged, confidential or otherwise protected from use or disclosure. If you are not the intended recipient, you are strictly prohibited from reviewing, disclosing, copying using or disseminating any of this information or taking any action in reliance on or regarding this information. If you have received this fax in error, please notify us immediately by telephone so that we can arrange for its return to us. Phone: [phone], Toll-Free: [phone], Fax: [phone] Page: 1 of 2 Call Id: [id number] [place] [facility] [facility] Client TELEPHONE ADVICE RECORD [facility] Patient Name: Eugene Mcguire K Gender: Male DOB: 10-21-1945 Age: [age] Y 13 D Return Phone Number: [phone] (Primary), [phone] (Secondary) Address: City[place]State[place]Zip: [place] [address] Client [place] [facility] - [facility] Client Site [facility] - Day Physician Tillman AbideLetvak, Richard - MD Contact Type Call Who Is Calling Patient / Member / Family / Caregiver Call Type Triage / Clinical Caller Name [facility] Relationship To Patient Provider Return Phone Number [phone] (Primary) Chief Complaint URINATE - sudden inability to urinate Reason for Call Symptomatic / Request for Health Information Initial Comment Caller states she needs to get an order for a temporary foley. Patient is having issues emptying bladder. CBN: [phone]. Patient is allergice to Rimantadine. Appointment Disposition EMR Appointment Not Necessary Info pasted into Epic No Translation No No Triage Reason Other Nurse Assessment Nurse: Willeen CassBennett, RN, Lelon MastSamantha Date/Time (Eastern Time): 02/06/2016 2:34:52  PM Confirm and document reason for call. If symptomatic, describe symptoms. You must click the next button to save text entered. 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 Caller states at breakfast shaking violently. He is blind, disoriented, has a seizure disorder, it was not a seizure. States he cannot empty bladder, began zyrtec past Thursday, has enlarged prostate. Calling for order for foley, received order from dr wendling to hold zyrtec, Debbie told dr he was in a skilled nursing facility, caller cathed pt this morning clean catch, bladder distended 30 minutes ago getting 1000ml each time on diuretics hctz on flomax, cannot leave facility no transportation, wife not in shape to take him. Caller requesting RN to contact on call for verbal order for foley. Has the patient traveled out of the country within the last 30 days? ---Not Applicable Does the patient have any new or worsening symptoms? ---Yes Will a triage be completed? ---No Select reason for no triage. ---Other Please document clinical information provided and list any resource used. ---pt is in skilled nursing facility. PLEASE NOTE: All timestamps contained within this report are represented as [place] Standard Time. CONFIDENTIALTY NOTICE: This fax transmission is intended only for the addressee. It contains information that is legally privileged, confidential or otherwise protected from use or disclosure. If you are not the intended recipient, you are strictly prohibited from reviewing, disclosing, copying using or disseminating any of this information or taking any action in reliance on or regarding this information. If you have received this fax in error, please notify us immediately by telephone so that we can arrange for its return to us. Phone: [phone], Toll-Free: [phone], Fax: [phone] Page: 2 of 2 Call Id: [id number] Guidelines Guideline Title Affirmed Question Affirmed Notes Nurse Date/Time  Lamount Cohen(Eastern Time) Disp. Time Lamount Cohen(Eastern Time) Disposition Final User 02/06/2016 2:25:57 PM Send to  Urgent Cristine PolioQueue Green, Amy 02/06/2016 2:38:04 PM Called On-Call Provider Willeen CassBennett, RN, Samantha 02/06/2016 2:42:24 PM Call Completed Willeen CassBennett, RN, Lelon MastSamantha 02/06/2016 2:42:14 PM Clinical Call Yes Willeen CassBennett, RN, Lelon MastSamantha Comments User: Delfina RedwoodSamantha, Bennett, RN Date/Time Lamount Cohen(Eastern Time): 02/06/2016 2:42:07 PM RN returned call to [place], [place]. Starlyn SkeansAdvised Cathy of Dr Hollie BeachWendling's verbal order to place foley. Caller verbalizes understanding and agrees, denies further needs at this time. Paging DoctorName Phone DateTime Result/Outcome Message Type Notes Carmelia RollerWendling, - MD [phone] 02/06/2016 2:38:04 PM Called On Call Provider - Reached Doctor Paged AsheboroWendling, - MD 02/06/2016 2:38:35 PM Spoke with On Call - General Message Result RN gave Dr Carmelia RollerWendling report. Dr gave verbal order to place foley. No further orders received.

## 2016-02-07 NOTE — Telephone Encounter (Signed)
Hold off on foley, in and out cath every 8 hours for now

## 2016-02-07 NOTE — Telephone Encounter (Signed)
PLEASE NOTE: All timestamps contained within this report are represented as [place] Standard Time. CONFIDENTIALTY NOTICE: This fax transmission is intended only for the addressee. It contains information that is legally privileged, confidential or otherwise protected from use or disclosure. If you are not the intended recipient, you are strictly prohibited from reviewing, disclosing, copying using or disseminating any of this information or taking any action in reliance on or regarding this information. If you have received this fax in error, please notify us immediately by telephone so that we can arrange for its return to us. Phone: [phone], Toll-Free: [phone], Fax: [phone] Page: 1 of 2 Call Id: [id number] [place] [facility] - Client TELEPHONE ADVICE RECORD [facility] Patient Name: Eugene Mcguire Gender: Male DOB: 07/12/1945 Age: [age] Y 11 D Return Phone Number: [phone] (Primary) Address: City/State/Zip: [place] Client [facility] [facility] - Client Client [facility] Night Physician Tillman AbideLetvak, Richard - MD Contact Type Call Who Is Calling Patient / Member / Family / Caregiver Call Type Triage / Clinical Caller Name Olegario MessierKathy Relationship To Patient Care Giver Return Phone Number [phone] (Primary) Chief Complaint Urination Pain Reason for Call Symptomatic / Request for Health Information Initial Comment Caller States one of the residents may have a UTI, [facility], he has an enlarged prostate, had some meds this week that may have made it difficult to urinate, temp of 99, had to put in a catheter Translation No Nurse Assessment Nurse: Para Marchaoud, RN, Gavin Poundeborah Date/Time (Eastern Time): 02/06/2016 11:47:09 AM Confirm and document reason for call. If symptomatic, describe symptoms. You must click the next button to save text entered. ---Caller [facility]. States this morning patient had difficulty urinating. Started on Zyrtec on Thursday. Also has an enlarged prostate. States she straight cathed him secondary to pain and did a clean catch for possible UA. Temp of 99. No obvious blood in urine. Urine was clear. Caller wants to know if they should hold the Zyrtec, should they do a foley, a UA or possible antibiotic. Has the patient traveled out of the country within the last 30 days? ---No Does the patient have any new or worsening symptoms? ---No Guidelines Guideline Title Affirmed Question Affirmed Notes Nurse Date/Time (Eastern Time) Disp. Time Lamount Cohen(Eastern Time) Disposition Final User 02/06/2016 11:54:53 AM Called On-Call Provider Daoud, RN, Deborah 02/06/2016 11:57:43 AM Clinical Call Yes Para Marchaoud, RN, Lynnea Maizeseborah PLEASE NOTE: All timestamps contained within this report are represented as [place] Standard Time. CONFIDENTIALTY NOTICE: This fax transmission is intended only for the addressee. It contains information that is legally privileged, confidential or otherwise protected from use or disclosure. If you are not the intended recipient, you are strictly prohibited from reviewing, disclosing, copying using or disseminating any of this information or taking any action in reliance on or regarding this information. If you have received this fax in error, please notify us immediately by telephone so that we can arrange for its return to us. Phone: [phone], Toll-Free: [phone], Fax: [phone] Page: 2 of 2 Call Id: [id number] Paging DoctorName Phone DateTime Result/Outcome Message Type Notes Carmelia RollerWendling, - MD [phone] 02/06/2016 11:54:53 AM Called On Call Provider - Reached Doctor Paged Carmelia RollerWendling, - MD 02/06/2016 11:57:07 AM Spoke with On Call - General Message Result Spoke with Dr. Carmelia RollerWendling who recommends they can hold the Zyrtec and if they are concerned about a UTI he should be sent to an UC  for evaluation.  Called and advised caller. Caller verbalized understanding.

## 2016-02-08 NOTE — Telephone Encounter (Signed)
Called memory care yesterday afternoon, and they stated that an on call Dr gave to okay for the foley already

## 2016-02-08 NOTE — Telephone Encounter (Signed)
noted 

## 2016-02-14 DIAGNOSIS — R339 Retention of urine, unspecified: Secondary | ICD-10-CM | POA: Diagnosis not present

## 2016-02-16 ENCOUNTER — Encounter: Payer: Self-pay | Admitting: Urology

## 2016-02-16 ENCOUNTER — Ambulatory Visit (INDEPENDENT_AMBULATORY_CARE_PROVIDER_SITE_OTHER): Payer: Medicare Other | Admitting: Urology

## 2016-02-16 VITALS — BP 100/59 | HR 70 | Ht 68.0 in | Wt 166.0 lb

## 2016-02-16 DIAGNOSIS — N138 Other obstructive and reflux uropathy: Secondary | ICD-10-CM | POA: Diagnosis not present

## 2016-02-16 DIAGNOSIS — R338 Other retention of urine: Secondary | ICD-10-CM | POA: Diagnosis not present

## 2016-02-16 DIAGNOSIS — N401 Enlarged prostate with lower urinary tract symptoms: Secondary | ICD-10-CM

## 2016-02-16 NOTE — Progress Notes (Signed)
02/16/2016 2:24 PM   Eugene Mcguire 1945-06-25 [id number]  Referring provider: Dianne Dunalia M Aron, MD [address] [place], [place] [address]  Chief Complaint  Patient presents with  . Benign Prostatic Hypertrophy    last seen 2016 by Eugene Mcguire? no notes in allscripts    HPI: Patient is a 70 year old Caucasian male who presents today with his wife, Eugene Mcguire, is referred for urinary retention by Dr. Dayton Mcguire.    Patient is a poor historian due to his Alzheimer's. His history is obtained from his wife who is present today.  His IPSS score today is 22, which is severe lower urinary tract symptomatology. He is unhappy with his quality life due to his urinary symptoms.    His major complaint today having a Foley.  He has had these symptoms for the last few weeks.  He denies any dysuria, hematuria or suprapubic pain.   He currently taking tamsulosin 0.4 mg and finasteride 5 mg daily.  He also denies any recent fevers, chills, nausea or vomiting.  He does not have a family history of PCa.      IPSS    Row Name 02/16/16 1300         International Prostate Symptom Score   How often have you had the sensation of not emptying your bladder? More than half the time     How often have you had to urinate less than every two hours? Almost always     How often have you found you stopped and started again several times when you urinated? Not at All     How often have you found it difficult to postpone urination? Not at All     How often have you had a weak urinary stream? Almost always     How often have you had to strain to start urination? Almost always     How many times did you typically get up at night to urinate? 3 Times     Total IPSS Score 22       Quality of Life due to urinary symptoms   If you were to spend the rest of your life with your urinary condition just the way it is now how would you feel about that? Unhappy        Score:  1-7 Mild 8-19 Moderate 20-35  Severe  PMH: Past Medical History:  Diagnosis Date  . Allergic rhinitis   . Alzheimer's dementia 2003   visualspacial difficulties  . 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 BPH (benign prostatic hyperplasia)   . Colon polyps   . Depression   . Hyperlipidemia   . Hypertension   . Seizure disorder [facility](HCC)     Surgical History: Past Surgical History:  Procedure Laterality Date  . CHOLECYSTECTOMY      Home Medications:    Medication List       Accurate as of 02/16/16  2:24 PM. Always use your most recent med list.          acetaminophen 650 MG CR tablet Commonly known as:  TYLENOL Take 650 mg by mouth every 8 (eight) hours as needed for pain.   atorvastatin 10 MG tablet Commonly known as:  LIPITOR TAKE ONE TABLET BY MOUTH ONCE DAILY   chlorhexidine 0.12 % solution Commonly known as:  PERIDEX Use as directed 15 mLs in the mouth or throat 2 (two) times daily.   donepezil 10 MG tablet Commonly known  as:  ARICEPT TAKE ONE TABLET BY MOUTH AT BEDTIME   finasteride 5 MG tablet Commonly known as:  PROSCAR Take 1 tablet (5 mg total) by mouth daily.   FISH OIL PO Take by mouth.   Fish Oil 1200 MG Caps Take 2 capsules by mouth daily   hydrochlorothiazide 25 MG tablet Commonly known as:  HYDRODIURIL Take 1 tablet (25 mg total) by mouth daily.   lamoTRIgine 150 MG tablet Commonly known as:  LAMICTAL Take 150 mg by mouth 2 (two) times daily.   memantine 10 MG tablet Commonly known as:  NAMENDA TAKE ONE TABLET BY MOUTH TWICE DAILY   MYLANTA PO Take 30 mLs by mouth as needed.   polyethylene glycol powder powder Commonly known as:  GLYCOLAX/MIRALAX Take 1 Container by mouth once.   PREVIDENT 5000 BOOSTER PLUS 1.1 % Pste Generic drug:  Sodium Fluoride Place onto teeth.   ramipril 5 MG capsule Commonly known as:  ALTACE TAKE ONE CAPSULE BY MOUTH ONCE DAILY   sertraline 25 MG tablet Commonly known as:  ZOLOFT TAKE ONE TABLET BY MOUTH AT  BEDTIME   tamsulosin 0.4 MG Caps capsule Commonly known as:  FLOMAX Take 1 capsule (0.4 mg total) by mouth daily.   vitamin B-12 100 MCG tablet Commonly known as:  CYANOCOBALAMIN Take by mouth.       Allergies:  Allergies  Allergen Reactions  . Rimantadine Nausea And Vomiting    Hallucinations    Family History: Family History  Problem Relation Age of Onset  . Alcohol abuse Father   . Heart disease Father   . Prostate cancer Neg Hx   . Kidney disease Neg Hx     Social History:  reports that he has quit smoking. He has never used smokeless tobacco. He reports that he does not drink alcohol or use drugs.  ROS: UROLOGY Frequent Urination?: No Hard to postpone urination?: No Burning/pain with urination?: No Get up at night to urinate?: No Leakage of urine?: No Urine stream starts and stops?: No Trouble starting stream?: No Do you have to strain to urinate?: No Blood in urine?: No Urinary tract infection?: No Sexually transmitted disease?: No Injury to kidneys or bladder?: No Painful intercourse?: No Weak stream?: No Erection problems?: No Penile pain?: No  Gastrointestinal Nausea?: No Vomiting?: No Indigestion/heartburn?: No Diarrhea?: No Constipation?: No  Constitutional Fever: No Night sweats?: No Weight loss?: No Fatigue?: No  Skin Skin rash/lesions?: No Itching?: No  Eyes Blurred vision?: No Double vision?: No  Ears/Nose/Throat Sore throat?: No Sinus problems?: No  Hematologic/Lymphatic Swollen glands?: No Easy bruising?: No  Cardiovascular Leg swelling?: No Chest pain?: No  Respiratory Cough?: No Shortness of breath?: No  Endocrine Excessive thirst?: No  Musculoskeletal Back pain?: No Joint pain?: No  Neurological Headaches?: No Dizziness?: No  Psychologic Depression?: No Anxiety?: No  Physical Exam: BP (!) 100/59   Pulse 70   Ht 5\' 8"  (1.727 m)   Wt 166 lb (75.3 kg)   BMI 25.24 kg/m   Constitutional: Well  nourished. Alert and oriented, No acute distress. HEENT: [place] AT, moist mucus membranes. Trachea midline, no masses. Cardiovascular: No clubbing, cyanosis, or edema. Respiratory: Normal respiratory effort, no increased work of breathing. GI: Abdomen is soft, non tender, non distended, no abdominal masses. Liver and spleen not palpable.  No hernias appreciated.  Stool sample for occult testing is not indicated.   GU: No CVA tenderness.  No bladder fullness or masses.  Patient with circumcised phallus.  Urethral meatus is patent. No penile discharge. No penile lesions or rashes. Foley found to be with tension and leg bag was strapped to the ankle.  Leg bag and Foley were adjusted to release tension and give slack.  Scrotum without lesions, cysts, rashes and/or edema.  Testicles are located scrotally bilaterally. No masses are appreciated in the testicles. Left and right epididymis are normal. Rectal: Patient with  normal sphincter tone. Anus and perineum without scarring or rashes. No rectal masses are appreciated. Prostate is approximately 40 grams, irregular nodules are appreciated. Seminal vesicles are normal. Skin: No rashes, bruises or suspicious lesions. Lymph: No cervical or inguinal adenopathy. Neurologic: Grossly intact, no focal deficits, moving all 4 extremities. Psychiatric: Normal mood and affect.  Laboratory Data: Lab Results  Component Value Date   HGB 13.9 10/21/2012   HCT 42 10/21/2012   PLT 356 10/21/2012    Lab Results  Component Value Date   CREATININE 0.80 09/28/2015       Component Value Date/Time   CHOL 235 (H) 03/09/2015 1214   HDL 64.90 03/09/2015 1214   CHOLHDL 4 03/09/2015 1214   VLDL 18.0 03/09/2015 1214   LDLCALC 152 (H) 03/09/2015 1214    Lab Results  Component Value Date   AST 20 09/28/2015   Lab Results  Component Value Date   ALT 17 09/28/2015      Assessment & Plan:    1. Acute urinary retention  - I explained to the wife however  patient's Alzheimer's disease is contributing to his bladder dysfunction  - I have contacted his [facility] asked and they're able to do CIC-they are able  - They will remove the Foley catheter and CIC every 8 hours and record residuals  - He will return to review those records and have a PVR  2. BPH with LUTS  - IPSS score is 22/5  - Continue conservative management, avoiding bladder irritants and timed voiding's  - Continue tamsulosin 0.4 mg daily and finasteride 5 mg daily  - RTC in one month for PVR    Return in about 1 month (around 03/17/2016) for PVR.  These notes generated with voice recognition software. I apologize for typographical errors.  Michiel CowboySHANNON Keyontay Stolz, PA-C  [facility] [address], [address] [place], [place] [address] [phone]

## 2016-02-25 ENCOUNTER — Telehealth: Payer: Self-pay | Admitting: Urology

## 2016-02-25 NOTE — Telephone Encounter (Signed)
Do they have the capability to do a bladder scan?

## 2016-02-25 NOTE — Telephone Encounter (Signed)
Please discuss this patient with Carollee HerterShannon. It sounds like the patient needs to continue to be straight cathed.  Vanna ScotlandAshley Chrishawn Boley, MD

## 2016-02-25 NOTE — Telephone Encounter (Signed)
Facility called stating current orders are to straight cath Q shift, requesting order change to PRN due to pt ability to void on his own. Please advise.

## 2016-02-25 NOTE — Telephone Encounter (Signed)
Spoke with Eugene Mcguire at pt facility in reference to PVRs post CIC. Eugene Mcguire stated that there are only 2 recorded volumes of residuals after cathing 11/14 @ 4:30am at 300cc and 11/16 on 2nd shift with 50cc. Eugene Mcguire also stated that it is documented that pt is either incontinent or urinating in between cath times.

## 2016-02-28 NOTE — Telephone Encounter (Signed)
No answer at pt facility.

## 2016-03-01 NOTE — Telephone Encounter (Signed)
Spoke with Rosey Batheresa and made aware to continue CIC and recording vlumes. Rosey Batheresa voiced understanding.

## 2016-03-01 NOTE — Telephone Encounter (Signed)
Then I would suggest to continue to CIC q shift and record volumes each time.  I would also suggest to CIC after an episode of incontinence or voiding to check a residual and record the volumes.

## 2016-03-01 NOTE — Telephone Encounter (Signed)
No facility is not able to do a bladder scan.

## 2016-03-20 ENCOUNTER — Ambulatory Visit: Payer: Self-pay | Admitting: Urology

## 2016-03-31 DIAGNOSIS — N400 Enlarged prostate without lower urinary tract symptoms: Secondary | ICD-10-CM | POA: Diagnosis not present

## 2016-03-31 DIAGNOSIS — F39 Unspecified mood [affective] disorder: Secondary | ICD-10-CM

## 2016-03-31 DIAGNOSIS — G301 Alzheimer's disease with late onset: Secondary | ICD-10-CM | POA: Diagnosis not present

## 2016-03-31 DIAGNOSIS — G40909 Epilepsy, unspecified, not intractable, without status epilepticus: Secondary | ICD-10-CM | POA: Diagnosis not present

## 2016-03-31 DIAGNOSIS — I10 Essential (primary) hypertension: Secondary | ICD-10-CM | POA: Diagnosis not present

## 2016-05-12 DIAGNOSIS — I10 Essential (primary) hypertension: Secondary | ICD-10-CM | POA: Diagnosis not present

## 2016-05-12 DIAGNOSIS — G40901 Epilepsy, unspecified, not intractable, with status epilepticus: Secondary | ICD-10-CM | POA: Diagnosis not present

## 2016-05-12 DIAGNOSIS — N401 Enlarged prostate with lower urinary tract symptoms: Secondary | ICD-10-CM | POA: Diagnosis not present

## 2016-05-12 DIAGNOSIS — F323 Major depressive disorder, single episode, severe with psychotic features: Secondary | ICD-10-CM | POA: Diagnosis not present

## 2016-05-12 DIAGNOSIS — G479 Sleep disorder, unspecified: Secondary | ICD-10-CM | POA: Diagnosis not present

## 2016-05-12 DIAGNOSIS — G309 Alzheimer's disease, unspecified: Secondary | ICD-10-CM | POA: Diagnosis not present

## 2016-06-16 ENCOUNTER — Telehealth: Payer: Self-pay | Admitting: Internal Medicine

## 2016-06-16 NOTE — Telephone Encounter (Signed)
Left pt message asking to call Revonda Standardllison back directly at [phone] to schedule AWV/CPE. Thanks!

## 2016-07-03 ENCOUNTER — Telehealth: Payer: Self-pay

## 2016-07-03 DIAGNOSIS — R1013 Epigastric pain: Secondary | ICD-10-CM | POA: Diagnosis not present

## 2016-07-03 NOTE — Telephone Encounter (Signed)
PLEASE NOTE: All timestamps contained within this report are represented as [place] Standard Time. CONFIDENTIALTY NOTICE: This fax transmission is intended only for the addressee. It contains information that is legally privileged, confidential or otherwise protected from use or disclosure. If you are not the intended recipient, you are strictly prohibited from reviewing, disclosing, copying using or disseminating any of this information or taking any action in reliance on or regarding this information. If you have received this fax in error, please notify us immediately by telephone so that we can arrange for its return to us. Phone: [phone], Toll-Free: [phone], Fax: [phone] Page: 1 of 1 Call Id: [id number] [place] [facility] - Client Nonclinical Telephone Record [facility] Client [place] [facility] - Client [facility] Site [place] [facility] Physician Tillman AbideLetvak, Richard - MD Contact Type Call Who Is Calling Physician / Provider / Hospital Call Type [facility] Reason for Call Request for Orders Initial Comment Caller is Olegario MessierKathy from [facility]; she has a pt. there who she believes has a uti. She is wanting a order called in for a urine analysis culture. Additional Comment Patient Name Eugene DarlingWilliam Georgi Patient DOB 01-14-1946 Requesting Provider Levon HedgerKathy Rn Physician Number [phone] [facility] Paging DoctorName Phone DateTime Result/Outcome Message Type Notes Evelena PeatBurchette, Bruce - MD [phone] 07/02/2016 4:51:44 PM Paged On Call Back to Call Center Doctor Paged Please call Eada with [facility] at [phone]. Evelena PeatBurchette, Bruce - MD 07/02/2016 4:58:10 PM Spoke with On Call - General Message Result Spoke with the on call and connected with the facility. Call Closed By: Sharyn LullAlyssa Smith Transaction Date/Time: 07/02/2016 4:45:20 PM (ET)

## 2016-07-03 NOTE — Telephone Encounter (Signed)
I saw him today Culture sent Empiric Rx with antibiotic begun

## 2016-07-26 DIAGNOSIS — F39 Unspecified mood [affective] disorder: Secondary | ICD-10-CM | POA: Diagnosis not present

## 2016-07-26 DIAGNOSIS — G309 Alzheimer's disease, unspecified: Secondary | ICD-10-CM | POA: Diagnosis not present

## 2016-07-26 DIAGNOSIS — K567 Ileus, unspecified: Secondary | ICD-10-CM | POA: Diagnosis not present

## 2016-07-26 DIAGNOSIS — N401 Enlarged prostate with lower urinary tract symptoms: Secondary | ICD-10-CM | POA: Diagnosis not present

## 2016-07-26 DIAGNOSIS — I10 Essential (primary) hypertension: Secondary | ICD-10-CM | POA: Diagnosis not present

## 2016-07-26 DIAGNOSIS — G479 Sleep disorder, unspecified: Secondary | ICD-10-CM | POA: Diagnosis not present

## 2016-08-02 NOTE — Telephone Encounter (Signed)
Pt in [facility]. Does not need AWV.

## 2016-09-08 ENCOUNTER — Telehealth: Payer: Self-pay

## 2016-09-08 DIAGNOSIS — R451 Restlessness and agitation: Secondary | ICD-10-CM | POA: Diagnosis not present

## 2016-09-08 DIAGNOSIS — F918 Other conduct disorders: Secondary | ICD-10-CM | POA: Diagnosis not present

## 2016-09-08 DIAGNOSIS — R443 Hallucinations, unspecified: Secondary | ICD-10-CM | POA: Diagnosis not present

## 2016-09-08 NOTE — Telephone Encounter (Signed)
Seen by Rene Kocheregina this AM with wife Still sedated from 2mg  lorazepam last night  He has declined and had agitation We are going to try prn lorazepam with plan to perhaps convert to standing doses

## 2016-09-08 NOTE — Telephone Encounter (Signed)
PLEASE NOTE: All timestamps contained within this report are represented as [place]. CONFIDENTIALTY NOTICE: This fax transmission is intended only for the addressee. It contains information that is legally privileged, confidential or otherwise protected from use or disclosure. If you are not the intended recipient, you are strictly prohibited from reviewing, disclosing, copying using or disseminating any of this information or taking any action in reliance on or regarding this information. If you have received this fax in error, please notify us immediately by telephone so that we can arrange for its return to us. Phone: [phone], Toll-Free: [phone], Fax: [phone] Page: 1 of 1 Call Id: [id number] [facility] Client Nonclinical Telephone Record [facility] Client [facility] Client Site [facility] [place] Day Physician Tillman AbideLetvak, Richard - MD Contact Type Call Who Is Calling Physician / Provider / Hospital Call Type Provider Call Message Only Reason for Call Request to speak to Physician Initial Comment 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 Patient Name Eugene DarlingWilliam Hagenow Patient DOB [birth year] Requesting Provider Christianne Dolinee Wilson Physician Number [phone] Facility Name [facility] Call Closed By: Graylin ShiverJason Beil Transaction Date/Time: 09/07/2016 5:57:12 PM (ET)

## 2016-09-08 NOTE — Telephone Encounter (Signed)
PLEASE NOTE: All timestamps contained within this report are represented as [place]. CONFIDENTIALTY NOTICE: This fax transmission is intended only for the addressee. It contains information that is legally privileged, confidential or otherwise protected from use or disclosure. If you are not the intended recipient, you are strictly prohibited from reviewing, disclosing, copying using or disseminating any of this information or taking any action in reliance on or regarding this information. If you have received this fax in error, please notify us immediately by telephone so that we can arrange for its return to us. Phone: [phone], Toll-Free: [phone], Fax: [phone] Page: 1 of 1 Call Id: [id number] [facility] Nonclinical Telephone Record [facility] - [facility] [place] Night Physician Tillman AbideLetvak, Richard - MD Contact Type Call Who Is Calling Physician / Provider / Hospital Call Type Provider Call [facility] Page Now Reason for Call Request for Orders Initial Comment Caller states [place] [facility] [phone] or [phone] Eugene Mcguire - May 28, 1945 - pt is blind and has a hx of Alzheimer's. Pt is confused, hallucinating and very combative - need an order for ativan. Additional Comment Need to speak to the on call about combative patient. Patient Name Eugene DarlingWilliam Mcguire Patient DOB May 28, 1945 Requesting Provider Geraldine Contrasee, LPN Physician Number [phone] or [phone] [facility] Phone DateTime Result/Outcome Message Type Notes Sonda Primeslotnikov, Alex - [place] [phone] 09/07/2016 6:45:47 PM Called On Call Provider - Reached Doctor Paged Sonda PrimesPlotnikov, Alex - MD 09/07/2016 6:47:24 PM Spoke with On Call - General Message Result Spoke with On Call, information provided and warm conference  the call. Call Closed By: Macario CarlsIngrid Hurst Transaction Date/Time: 09/07/2016 6:32:15 PM (ET)

## 2016-09-08 NOTE — Telephone Encounter (Signed)
PLEASE NOTE: All timestamps contained within this report are represented as [place]. CONFIDENTIALTY NOTICE: This fax transmission is intended only for the addressee. It contains information that is legally privileged, confidential or otherwise protected from use or disclosure. If you are not the intended recipient, you are strictly prohibited from reviewing, disclosing, copying using or disseminating any of this information or taking any action in reliance on or regarding this information. If you have received this fax in error, please notify us immediately by telephone so that we can arrange for its return to us. Phone: [phone], Toll-Free: [phone], Fax: [phone] Page: 1 of 1 Call Id: [id number] [facility] Nonclinical Telephone Record [facility] Contact Type Call Who Is Calling Physician / Provider / Hospital Call Type [facility] Reason for Call Request for Physician Consult Initial Comment [facility]ee [facility] just spoke with the on-call physician, but needs to speak with him again regarding a patient/Ativan. Additional Comment Patient Name Eugene DarlingWilliam Mcguire Patient DOB 04/21/1945 [facility]ing [facility] Number [phone] Backup number: [phone] [facility] DateTime Result/Outcome Message Type Notes Sonda Primeslotnikov, Alex - MD [phone] 09/07/2016 7:10:34 PM Called On Call Provider - Reached Doctor Paged Sonda PrimesPlotnikov, Alex - MD 09/07/2016 7:10:39 PM Spoke with On Call - General Message Result Call Closed By: Dennison BullaJason Monroe Transaction Date/Time: 09/07/2016 7:05:17 PM (ET)

## 2016-09-15 DIAGNOSIS — N400 Enlarged prostate without lower urinary tract symptoms: Secondary | ICD-10-CM | POA: Diagnosis not present

## 2016-09-15 DIAGNOSIS — I10 Essential (primary) hypertension: Secondary | ICD-10-CM | POA: Diagnosis not present

## 2016-09-15 DIAGNOSIS — G301 Alzheimer's disease with late onset: Secondary | ICD-10-CM | POA: Diagnosis not present

## 2016-09-15 DIAGNOSIS — F39 Unspecified mood [affective] disorder: Secondary | ICD-10-CM | POA: Diagnosis not present

## 2016-09-15 DIAGNOSIS — R569 Unspecified convulsions: Secondary | ICD-10-CM | POA: Diagnosis not present

## 2016-10-05 DIAGNOSIS — R451 Restlessness and agitation: Secondary | ICD-10-CM | POA: Diagnosis not present

## 2016-10-23 DIAGNOSIS — R451 Restlessness and agitation: Secondary | ICD-10-CM | POA: Diagnosis not present

## 2016-10-24 DIAGNOSIS — I10 Essential (primary) hypertension: Secondary | ICD-10-CM | POA: Diagnosis not present

## 2016-10-24 DIAGNOSIS — R634 Abnormal weight loss: Secondary | ICD-10-CM | POA: Diagnosis not present

## 2016-10-24 DIAGNOSIS — G309 Alzheimer's disease, unspecified: Secondary | ICD-10-CM | POA: Diagnosis not present

## 2016-10-24 DIAGNOSIS — F0280 Dementia in other diseases classified elsewhere without behavioral disturbance: Secondary | ICD-10-CM | POA: Diagnosis not present

## 2016-11-08 DIAGNOSIS — N401 Enlarged prostate with lower urinary tract symptoms: Secondary | ICD-10-CM | POA: Diagnosis not present

## 2016-11-08 DIAGNOSIS — G40901 Epilepsy, unspecified, not intractable, with status epilepticus: Secondary | ICD-10-CM | POA: Diagnosis not present

## 2016-11-08 DIAGNOSIS — F39 Unspecified mood [affective] disorder: Secondary | ICD-10-CM | POA: Diagnosis not present

## 2016-11-08 DIAGNOSIS — G309 Alzheimer's disease, unspecified: Secondary | ICD-10-CM | POA: Diagnosis not present

## 2016-11-08 DIAGNOSIS — I10 Essential (primary) hypertension: Secondary | ICD-10-CM | POA: Diagnosis not present

## 2016-11-10 DIAGNOSIS — R451 Restlessness and agitation: Secondary | ICD-10-CM | POA: Diagnosis not present

## 2016-12-02 DIAGNOSIS — R443 Hallucinations, unspecified: Secondary | ICD-10-CM | POA: Diagnosis not present

## 2016-12-20 DIAGNOSIS — S30810A Abrasion of lower back and pelvis, initial encounter: Secondary | ICD-10-CM | POA: Diagnosis not present

## 2017-01-03 DIAGNOSIS — R100 Acute abdomen: Secondary | ICD-10-CM | POA: Diagnosis not present

## 2017-01-03 DIAGNOSIS — K5900 Constipation, unspecified: Secondary | ICD-10-CM | POA: Diagnosis not present

## 2017-01-10 DIAGNOSIS — R140 Abdominal distension (gaseous): Secondary | ICD-10-CM | POA: Diagnosis not present

## 2017-01-11 ENCOUNTER — Other Ambulatory Visit: Payer: Self-pay | Admitting: Internal Medicine

## 2017-01-11 MED ORDER — MORPHINE SULFATE (CONCENTRATE) 20 MG/ML PO SOLN: 20.0000 mg | Freq: Two times a day (BID) | ORAL | 0 refills | Status: AC | PRN

## 2017-02-08 DEATH — deceased

## 2017-04-02 IMAGING — CT CT ABD-PELV W/ CM
2 of 5 series · 14 of 46 positions shown, 16 images · IV contrast (OMNIPAQUE 300)
Comparison: None.

CLINICAL DATA: Periumbilical pain and right lower quadrant pain
with increased belching since 04/11/2015.

EXAM:
CT ABDOMEN AND PELVIS WITH CONTRAST
TECHNIQUE: Multidetector CT imaging of the abdomen and pelvis was performed
using the standard protocol following bolus administration of
intravenous contrast.
CONTRAST:  100mL OMNIPAQUE IOHEXOL 300 MG/ML  SOLN

[Series 2: abd/ pelvis · axial · 0.78mm/px · z∈[-514,-119]mm · 11 of 89 slices shown, 13 images]
[im 5/89  soft-tissue]
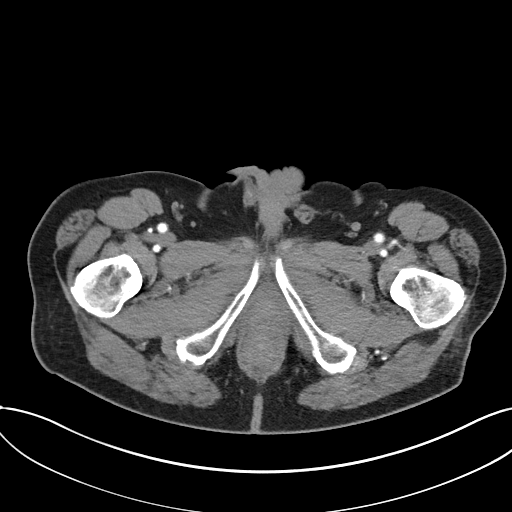
[im 5/89  bone]
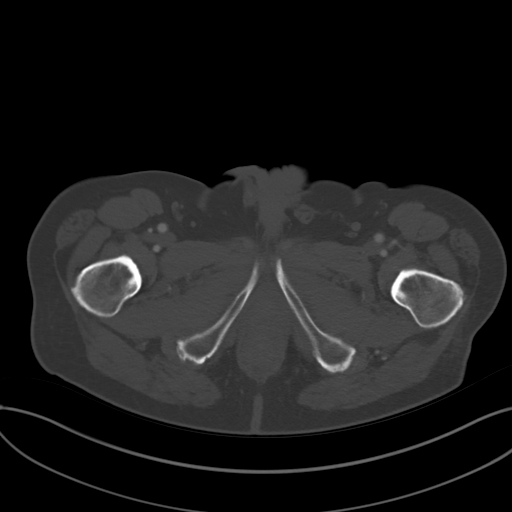
[im 14/89  soft-tissue]
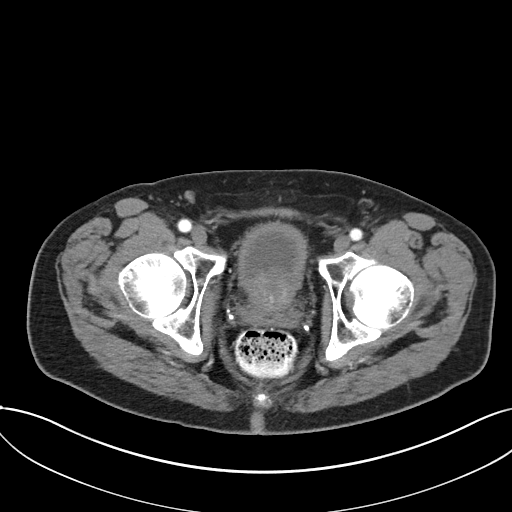
[im 24/89  soft-tissue]
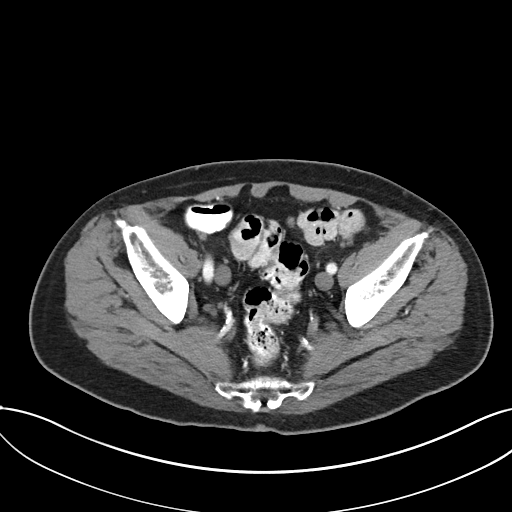
[im 28/89  soft-tissue]
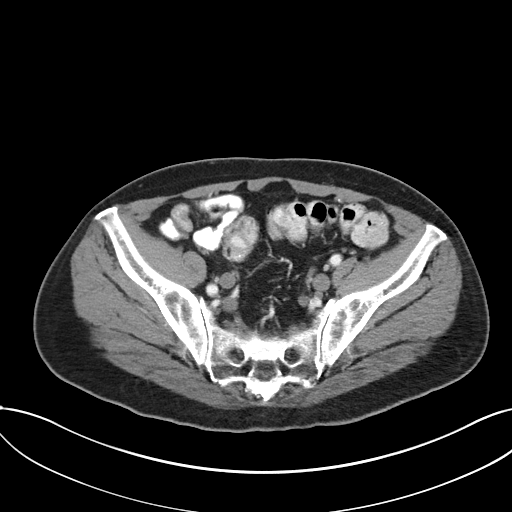
[im 38/89  soft-tissue]
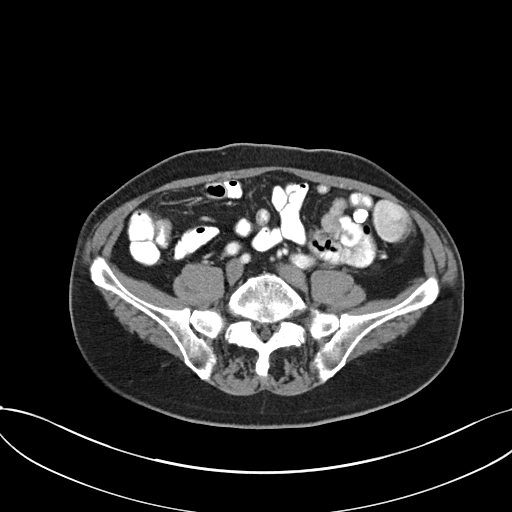
[im 47/89  soft-tissue]
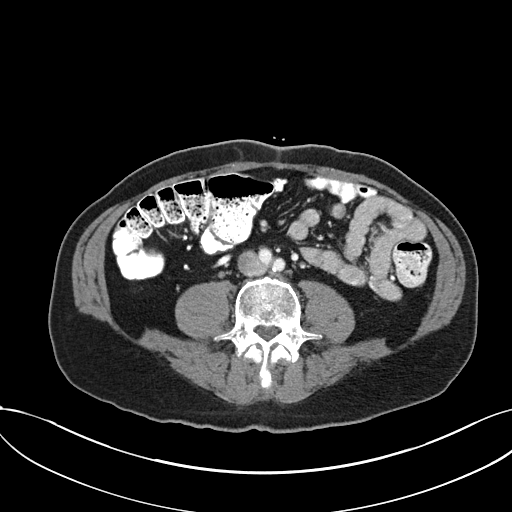
[im 51/89  soft-tissue]
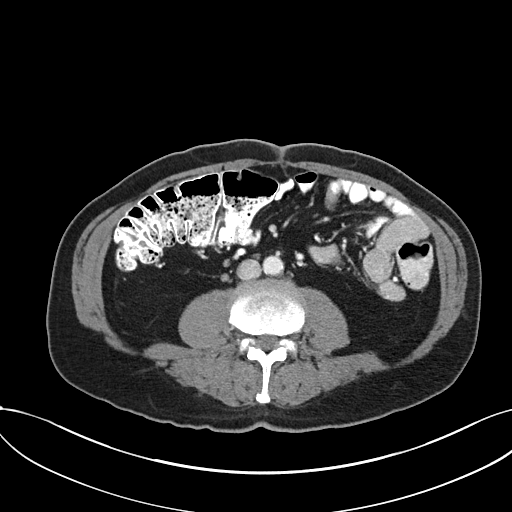
[im 61/89  soft-tissue]
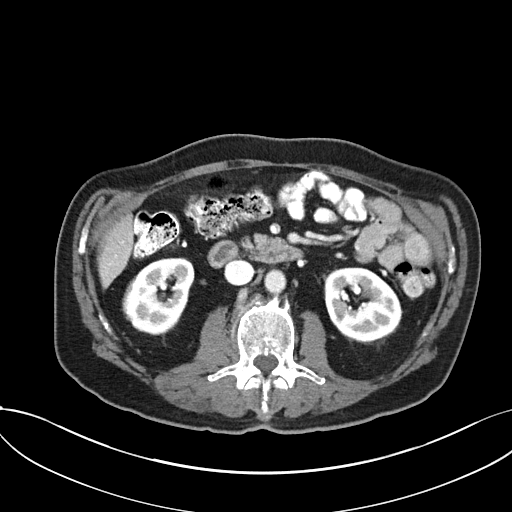
[im 65/89  soft-tissue]
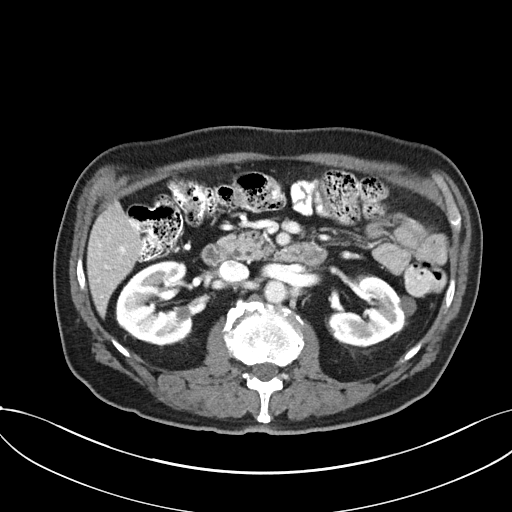
[im 65/89  bone]
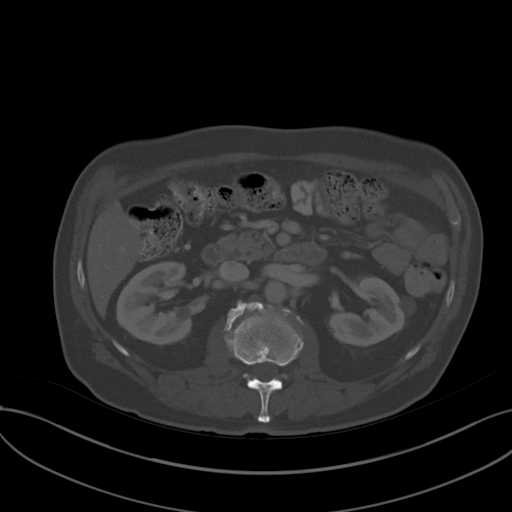
[im 75/89  soft-tissue]
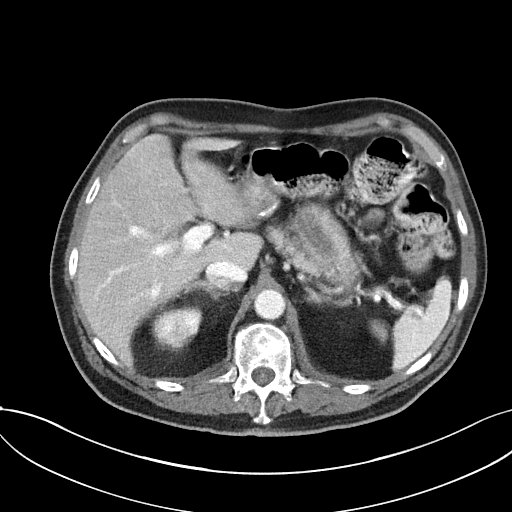
[im 84/89  soft-tissue]
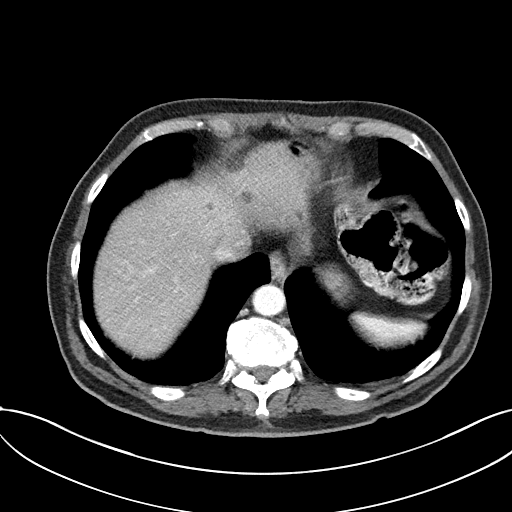

[Series 5: coronal soft tissue · coronal · 0.66mm/px · 3 of 87 slices shown]
[im 29/87  soft-tissue]
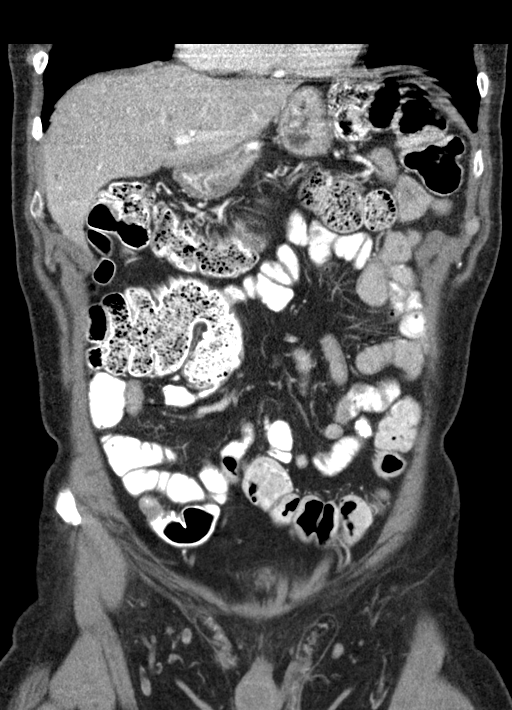
[im 39/87  soft-tissue]
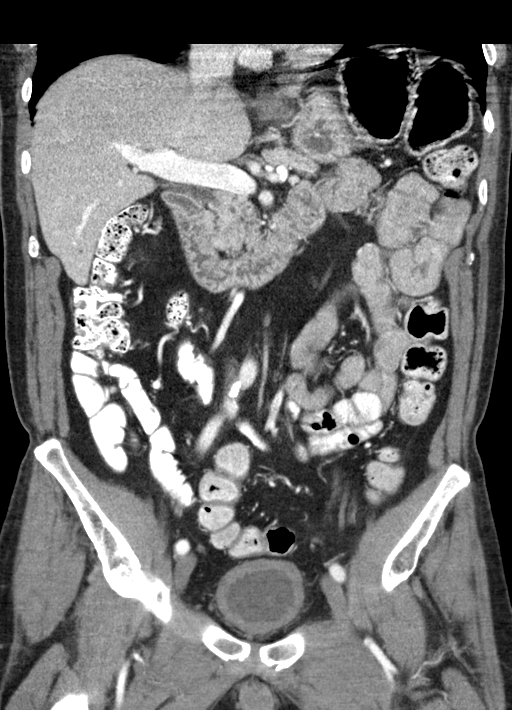
[im 48/87  soft-tissue]
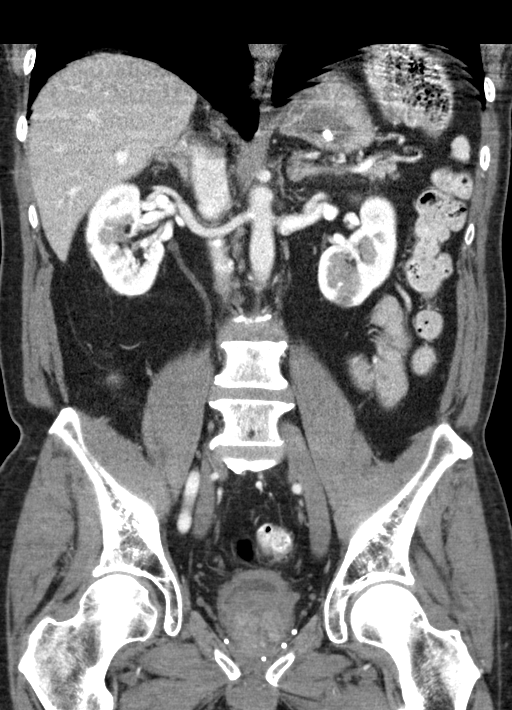

[14 of 46 positions shown; findings below may reference images not displayed]

FINDINGS: Lung bases are within normal.

Abdominal images demonstrate several well-defined hypodensities
within the liver with the largest over the left lobe measuring
cm compatible with cysts. There has been a prior cholecystectomy.
There is a rim calcified 6 mm density projected over the region of
the expected junction of the cystic duct to common bile duct as
cannot exclude a residual ductal stone. There is a 2 mm hypodensity
within the duodenal C- sweep just above the ampulla of uncertain
clinical significance.

There is a somewhat linear/tubular region of increased density with
mild irregularity within the stomach extending from the posterior
wall of the fundus into the anterior body of the stomach of
uncertain clinical significance.

The spleen, pancreas and adrenal glands are within normal.

Kidneys are normal in size without hydronephrosis or
nephrolithiasis. 2.6 cm simple cyst over the lower pole of the left
kidney. There is a 1.4 cm hypodensity over the left lower pole as
well as 1.5 cm hypodensity over the left mid pole which are
homogeneous with slight increased density measurements as these are
indeterminate but likely cysts. There are several other small
bilateral sub cm renal cortical hypodensities too small to
characterize but likely cysts. Ureters are within normal.

There is minimal calcified plaque over the abdominal aorta.

Appendix is normal. Mild fecal retention and contrast throughout the
colon. Small bowel is within normal. 1 cm calcification over the
left abdominal mesentery. No evidence of free fluid or free
peritoneal air.

Pelvic images demonstrate slightly decompressed bladder with mild
wall thickening which may be due to the degree of underdistention,
although could be seen with infection/cystitis. The prostate and
rectum are within normal. No free pelvic fluid or adenopathy.

There are degenerative changes of the spine and hips. Schmorl's
nodes over the superior endplate of T11 and T12 causing minimal loss
of mid to anterior vertebral body height.
IMPRESSION: Previous cholecystectomy. 6 mm rim calcified structure in the
expected region of the junction of the cystic duct 2 common bile
duct likely a residual cystic duct stone. MRCP versus ERCP may be
helpful for further evaluation.

Somewhat linear/tubular radiodensity extending from the posterior
wall of the gastric fundus into the body of the stomach uncertain
clinical significance as cannot completely exclude foreign body. 2
mm radiodensity over the duodenal C- sweep of uncertain clinical
significance as maybe residual contrast, small stone passed from the
biliary system or small foreign body.

Multiple liver cysts.

2.6 cm left renal cyst. Multiple small sub cm bilateral renal
cortical hypodensities too small to characterize but likely cysts.
Two small renal hypodensities likely hyperdense cysts, but
indeterminate. Consider CT pre and post contrast for further
characterization.

Decompressed bladder with mild circumferential wall thickening
likely due to decompression although could be seen with
infection/cystitis.

Prominent Schmorl's nodes over the superior endplates of T11 and T12
causing mild mid anterior vertebral body height loss.
